# Patient Record
Sex: Female | Born: 1980 | Race: Black or African American | Hispanic: No | Smoking: Never smoker
Health system: Southern US, Community
[De-identification: ages and names within clinical notes are randomized; demographics above are authoritative.]

## PROBLEM LIST (undated history)

## (undated) DIAGNOSIS — E162 Hypoglycemia, unspecified: Secondary | ICD-10-CM

## (undated) DIAGNOSIS — Z789 Other specified health status: Secondary | ICD-10-CM

## (undated) HISTORY — DX: Hypoglycemia, unspecified: E16.2

---

## 2001-04-15 ENCOUNTER — Other Ambulatory Visit: Admission: RE | Admit: 2001-04-15 | Discharge: 2001-04-15 | Payer: Self-pay | Admitting: Family Medicine

## 2002-03-24 ENCOUNTER — Ambulatory Visit (HOSPITAL_COMMUNITY): Admission: RE | Admit: 2002-03-24 | Discharge: 2002-03-24 | Payer: Self-pay | Admitting: Internal Medicine

## 2002-03-24 ENCOUNTER — Encounter: Payer: Self-pay | Admitting: Internal Medicine

## 2002-05-13 ENCOUNTER — Ambulatory Visit (HOSPITAL_COMMUNITY): Admission: RE | Admit: 2002-05-13 | Discharge: 2002-05-13 | Payer: Self-pay | Admitting: Internal Medicine

## 2002-06-03 ENCOUNTER — Ambulatory Visit (HOSPITAL_COMMUNITY): Admission: RE | Admit: 2002-06-03 | Discharge: 2002-06-03 | Payer: Self-pay | Admitting: Internal Medicine

## 2003-03-05 ENCOUNTER — Emergency Department (HOSPITAL_COMMUNITY): Admission: EM | Admit: 2003-03-05 | Discharge: 2003-03-05 | Payer: Self-pay | Admitting: *Deleted

## 2003-04-03 ENCOUNTER — Encounter: Payer: Self-pay | Admitting: Urology

## 2003-04-03 ENCOUNTER — Ambulatory Visit (HOSPITAL_COMMUNITY): Admission: RE | Admit: 2003-04-03 | Discharge: 2003-04-03 | Payer: Self-pay | Admitting: Urology

## 2004-08-24 ENCOUNTER — Emergency Department (HOSPITAL_COMMUNITY): Admission: EM | Admit: 2004-08-24 | Discharge: 2004-08-24 | Payer: Self-pay | Admitting: Emergency Medicine

## 2005-01-23 ENCOUNTER — Ambulatory Visit (HOSPITAL_COMMUNITY): Admission: RE | Admit: 2005-01-23 | Discharge: 2005-01-23 | Payer: Self-pay | Admitting: Obstetrics and Gynecology

## 2005-08-26 ENCOUNTER — Emergency Department (HOSPITAL_COMMUNITY): Admission: EM | Admit: 2005-08-26 | Discharge: 2005-08-26 | Payer: Self-pay | Admitting: Emergency Medicine

## 2007-11-18 ENCOUNTER — Ambulatory Visit (HOSPITAL_COMMUNITY): Admission: RE | Admit: 2007-11-18 | Discharge: 2007-11-18 | Payer: Self-pay | Admitting: Obstetrics

## 2007-12-06 ENCOUNTER — Ambulatory Visit (HOSPITAL_COMMUNITY): Admission: RE | Admit: 2007-12-06 | Discharge: 2007-12-06 | Payer: Self-pay | Admitting: Obstetrics

## 2008-02-28 ENCOUNTER — Inpatient Hospital Stay (HOSPITAL_COMMUNITY): Admission: AD | Admit: 2008-02-28 | Discharge: 2008-02-28 | Payer: Self-pay | Admitting: Obstetrics

## 2008-03-03 ENCOUNTER — Inpatient Hospital Stay (HOSPITAL_COMMUNITY): Admission: AD | Admit: 2008-03-03 | Discharge: 2008-03-03 | Payer: Self-pay | Admitting: Obstetrics

## 2008-03-08 ENCOUNTER — Ambulatory Visit (HOSPITAL_COMMUNITY): Admission: RE | Admit: 2008-03-08 | Discharge: 2008-03-08 | Payer: Self-pay | Admitting: Obstetrics & Gynecology

## 2008-03-23 ENCOUNTER — Inpatient Hospital Stay (HOSPITAL_COMMUNITY): Admission: AD | Admit: 2008-03-23 | Discharge: 2008-03-23 | Payer: Self-pay | Admitting: Obstetrics

## 2008-04-16 ENCOUNTER — Inpatient Hospital Stay (HOSPITAL_COMMUNITY): Admission: AD | Admit: 2008-04-16 | Discharge: 2008-04-20 | Payer: Self-pay | Admitting: Obstetrics

## 2008-04-16 ENCOUNTER — Encounter: Payer: Self-pay | Admitting: Obstetrics

## 2009-10-18 IMAGING — US US FETAL BPP W/O NONSTRESS
1 series · 14 of 28 positions shown · non-contrast
Comparison: none

OBSTETRICAL ULTRASOUND:
 This ultrasound exam was performed in the [HOSPITAL] Ultrasound Department.  The OB US report was generated in the AS system, and faxed to the ordering physician.  This report is also available in [REDACTED] PACS.

[Series 1: us fetal bpp w/o nonstress · non-contrast · 30 acquisitions, 14 frames shown]
[im 2/30]
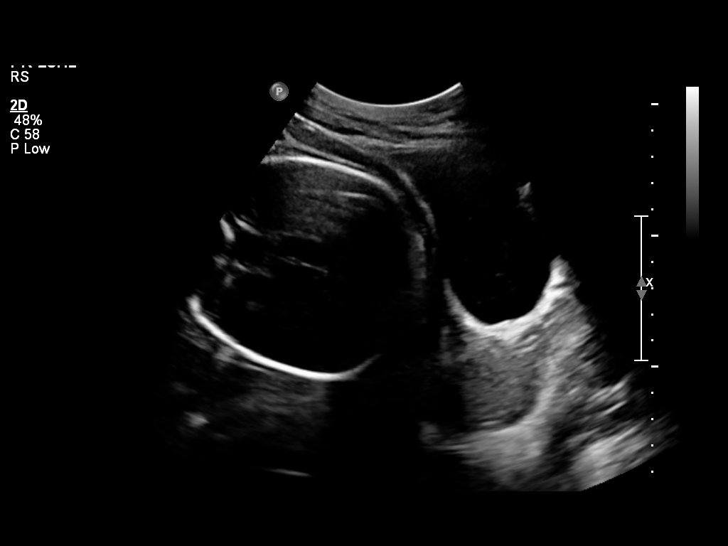
[im 4/30]
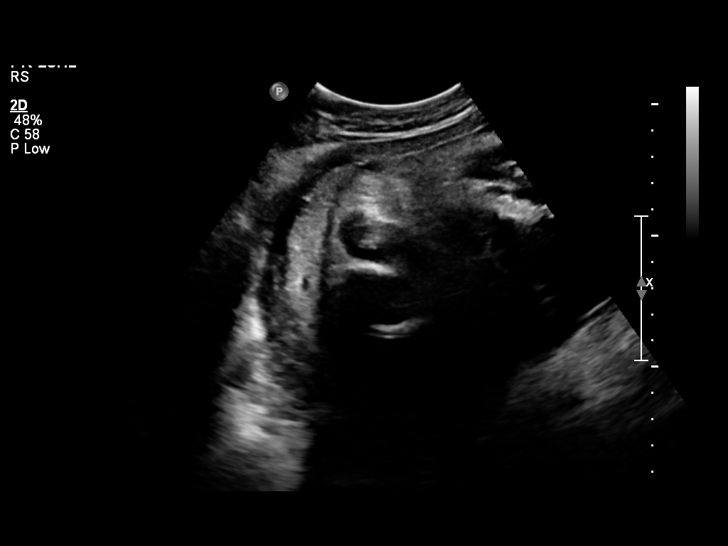
[im 6/30]
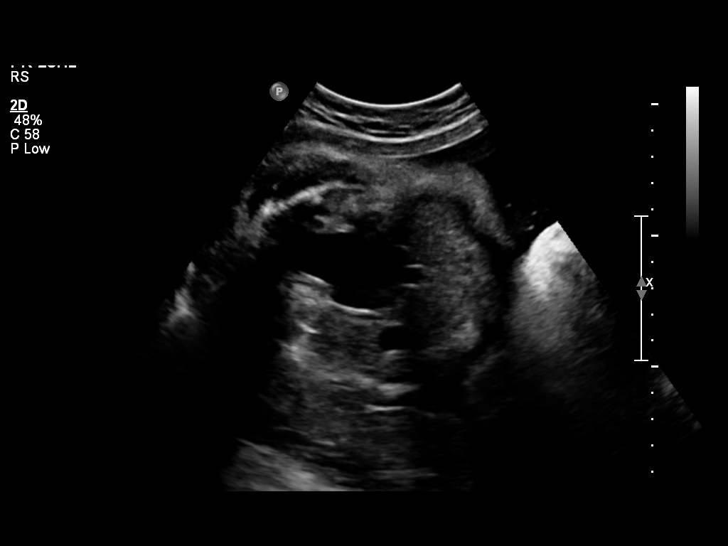
[im 8/30]
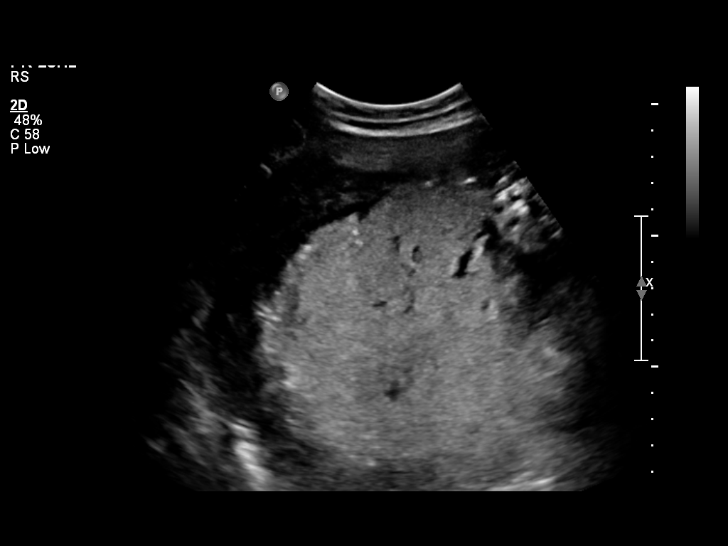
[im 10/30]
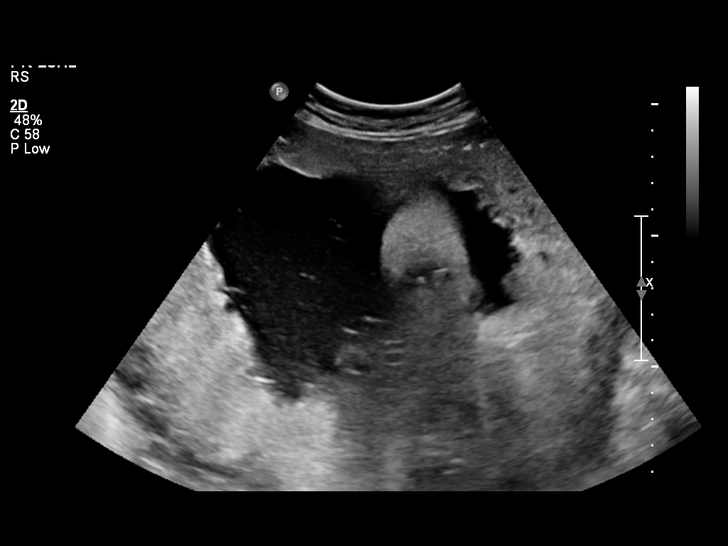
[im 12/30]
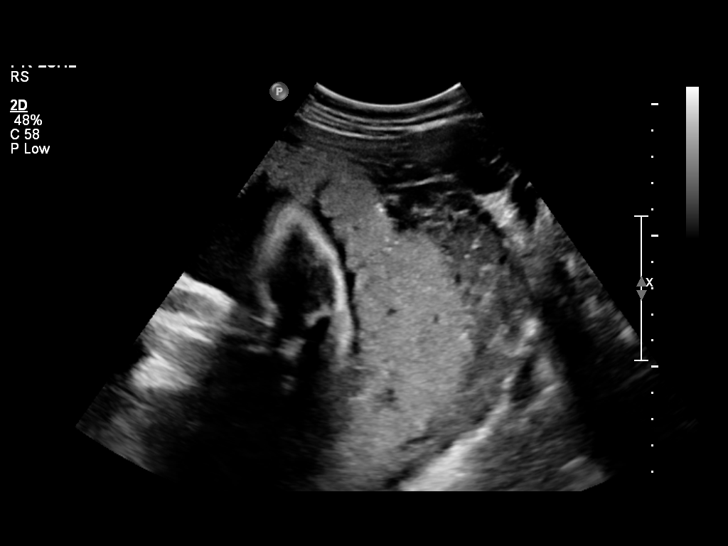
[im 14/30]
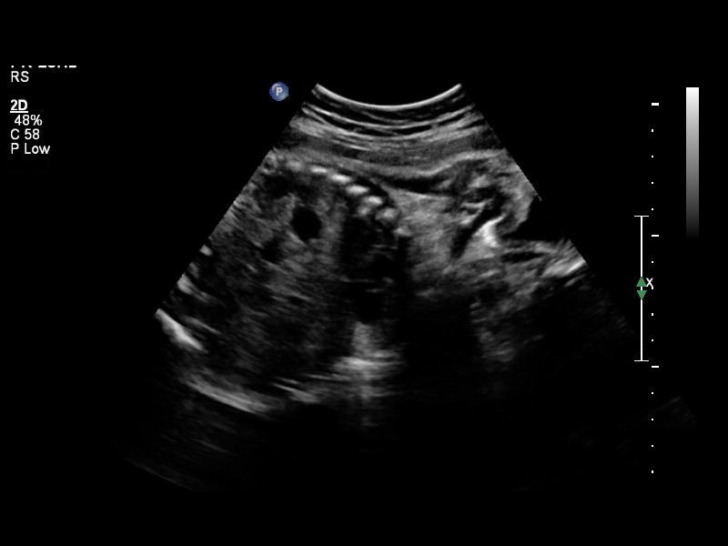
[im 17/30]
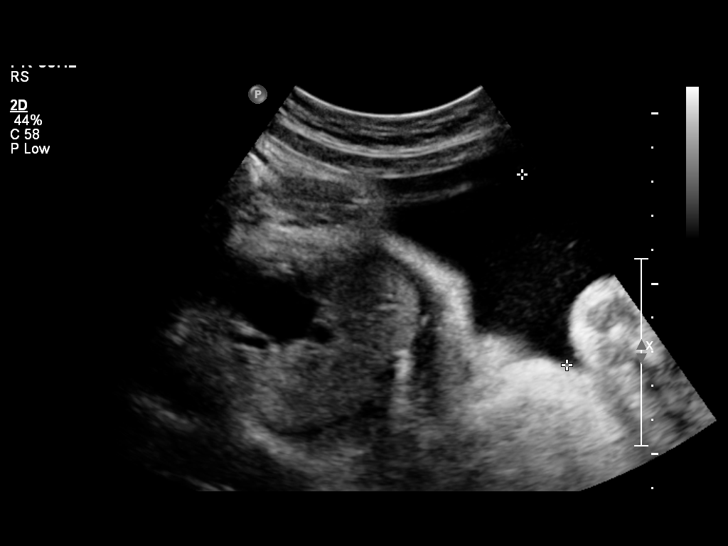
[im 19/30]
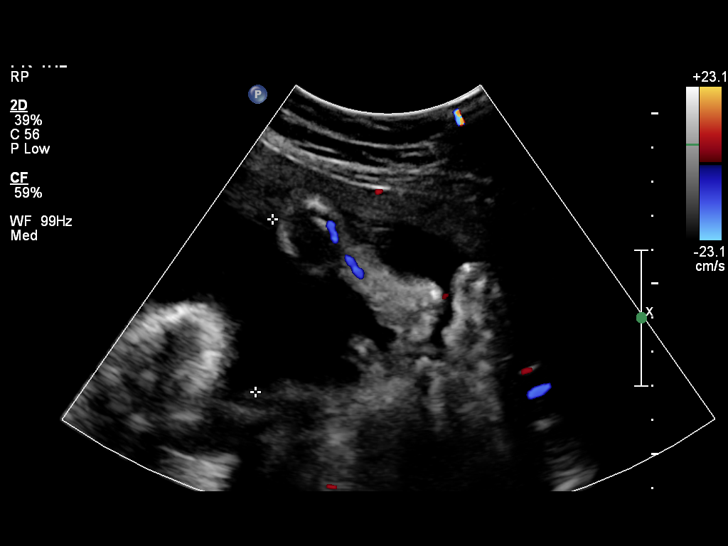
[im 21/30]
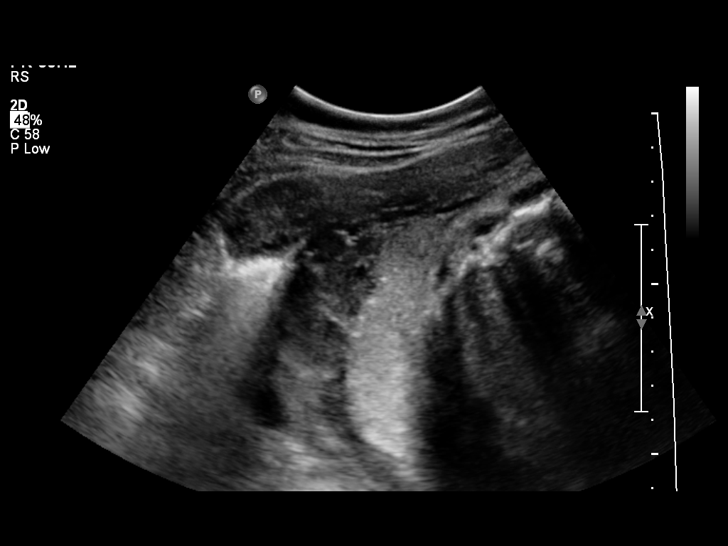
[im 23/30]
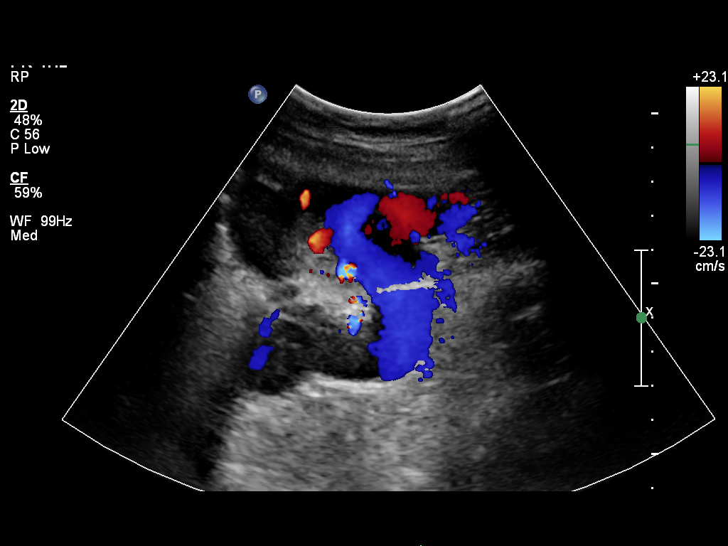
[im 25/30]
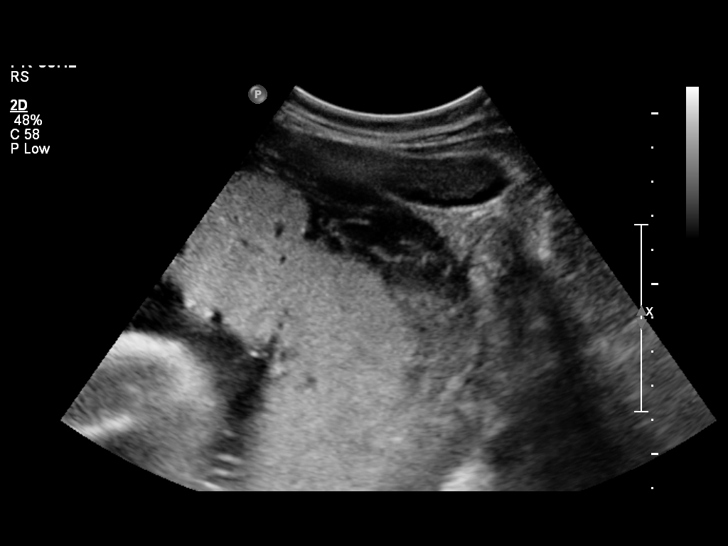
[im 27/30]
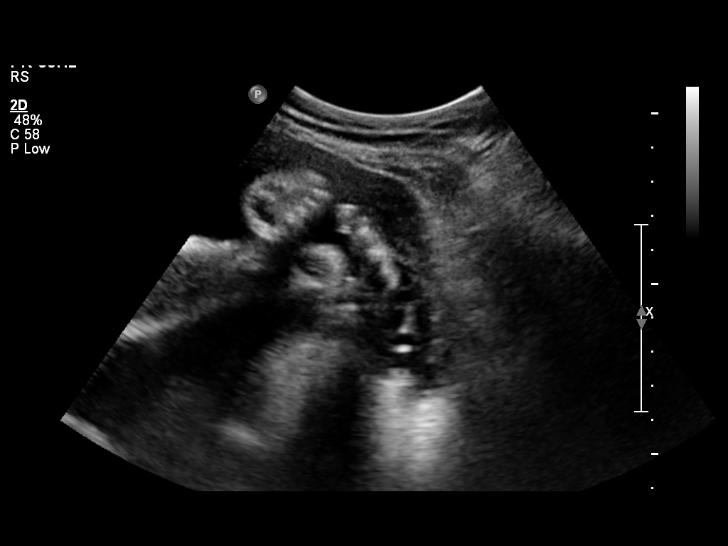
[im 30/30]
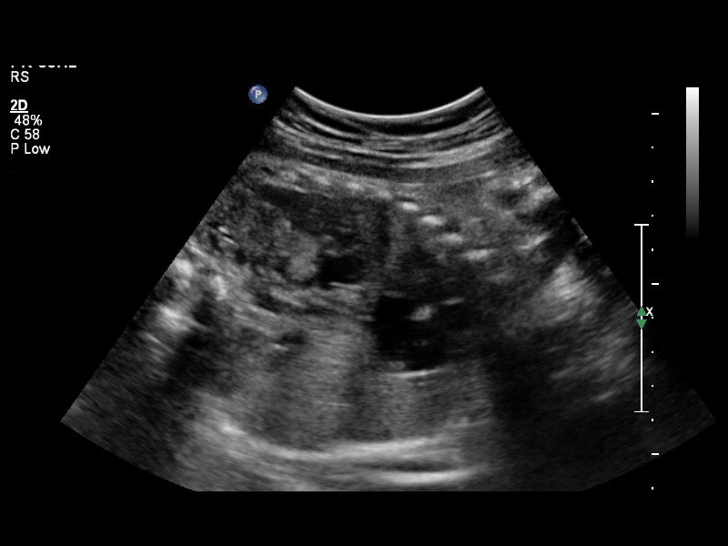

[14 of 28 positions shown; findings below may reference images not displayed]

IMPRESSION: See AS Obstetric US report.

## 2009-12-19 ENCOUNTER — Emergency Department (HOSPITAL_COMMUNITY): Admission: EM | Admit: 2009-12-19 | Discharge: 2009-12-19 | Payer: Self-pay | Admitting: Emergency Medicine

## 2010-01-14 ENCOUNTER — Emergency Department (HOSPITAL_COMMUNITY)
Admission: EM | Admit: 2010-01-14 | Discharge: 2010-01-14 | Payer: Self-pay | Source: Home / Self Care | Admitting: Family Medicine

## 2010-09-04 ENCOUNTER — Encounter
Admission: RE | Admit: 2010-09-04 | Discharge: 2010-10-02 | Payer: Self-pay | Source: Home / Self Care | Attending: Sports Medicine | Admitting: Sports Medicine

## 2010-10-06 ENCOUNTER — Encounter: Payer: Self-pay | Admitting: Obstetrics

## 2010-10-11 ENCOUNTER — Emergency Department (HOSPITAL_COMMUNITY)
Admission: EM | Admit: 2010-10-11 | Discharge: 2010-10-11 | Payer: Self-pay | Source: Home / Self Care | Admitting: Emergency Medicine

## 2011-01-28 NOTE — Op Note (Signed)
NAME:  Mackenzie Pierce, Mackenzie Pierce             ACCOUNT NO.:  [id number]   MEDICAL RECORD NO.:  [id number]          PATIENT TYPE:  INP   LOCATION:  9169                          FACILITY:  [facility]   PHYSICIAN:  Charles A. Clearance Coots, M.D.DATE OF BIRTH:  1981/03/13   DATE OF PROCEDURE:  04/17/2008  DATE OF DISCHARGE:                               OPERATIVE REPORT   PREOPERATIVE DIAGNOSES:  1. Late fetal heart rate decelerations.  2. Early first stage of labor.  3. Previous cesarean section.   POSTOPERATIVE DIAGNOSES:  1. Late fetal heart rate decelerations.  2. Early first stage of labor.  3. Previous cesarean section.   PROCEDURE:  Repeat low-transverse cesarean section.   SURGEON:  Charles A. Clearance Coots, MD   ASSISTANT:  Orlie Dakin, surgical technician.   ANESTHESIA:  Epidural.   ESTIMATED BLOOD LOSS:  700 mL.   IV FLUIDS:  2000 mL.   URINE OUTPUT:  300 mL clear.   COMPLICATIONS:  None.   DRAINS:  Foley to gravity.   FINDINGS:  Viable female at 23:45, Apgars of 8 at 1 and 9 at 5 minutes,  weight of 8 pounds 15 ounces.  Normal uterus, ovaries, and fallopian  tubes.   OPERATION:  The patient was brought to the operating room and after  satisfactory redosing of the epidural, the abdomen was prepped and  draped in the usual sterile fashion.  A Pfannenstiel skin incision was  made through the previous scar with a scalpel down to the fascia.  Fascia was nicked in the midline and the fascial incision was extended  to the left and to the right with curved Mayo scissors.  The superior  and inferior fascial edges were taken off the rectus muscle with blunt  and sharp dissection.  Rectus muscle was bluntly and sharply divided in  the midline.  Peritoneum was entered digitally and was digitally  extended to the left and to the right.  The bladder blade was  positioned.  The vesicouterine fold of peritoneum above the reflection  of the urinary bladder was grasped with forceps and was incised and  undermined with Metzenbaum scissors.  The incision was extended to the  left and to the right with the Metzenbaum scissors.  The bladder flap  was bluntly developed and the bladder blade was repositioned in front of  the urinary bladder placing it well out of the operative field.  The  uterus was then entered transversely in lower uterine segment with the  scalpel.  Clear amniotic fluid was expelled.  There were some flecks of  meconium in the amniotic fluid.  The vertex was noted to be left occiput  transverse and hyperextended and not well engaged.  The occiput was  brought up into the incision and could not be delivered with aid of  fundal pressure along the Mini VAC mushroom vacuum mixed extractor was  then applied to the occiput and was further flexed into the incision and  delivered with the aid of fundal pressure from the assistant.  The  infant's mouth and nose were suctioned with a suction bulb and the  delivery was  completed with the aid of fundal pressure from the  assistant.  The umbilical cord was doubly clamped and cut, and the  infant was handed off to the nursery staff.  The placenta was then  manually removed from the uterus, intact.  Cord blood was obtained.  Cord pH was 7.19.  The endometrial surface of the uterus was thoroughly  debrided with a dry lap sponge.  The edges of the uterine incision were  grasped with a ring forceps.  The uterus was closed with a continuous  interlocking suture of 0 Monocryl from each corner to the center.  Hemostasis was excellent.  Pelvic cavity was thoroughly irrigated with  warm saline solution.  All clots were removed.  The abdomen was then  closed as follows.  The parietal peritoneum was closed with a continuous  suture of 2-0 Monocryl.  The fascia was closed with continuous suture of  0 Vicryl.  The subcutaneous tissue was thoroughly irrigated with warm  saline solution.  All areas of subcutaneous bleeding was coagulated with  the  Bovie.  The skin was then approximated with surgical stainless steel  staples.  Sterile bandage was applied to the incision closure.  Surgical  technician indicated that all needle, sponge, and instrument counts were  correct x2.  The patient tolerated the procedure well and transported to  the recovery room in satisfactory condition.      Charles A. Clearance Coots, M.D.  Electronically Signed     CAH/MEDQ  D:  04/17/2008  T:  04/17/2008  Job:  [id number]

## 2011-01-31 NOTE — Op Note (Signed)
NAME:  Mackenzie Pierce                    ACCOUNT NO.:  [id number]   MEDICAL RECORD NO.:  [id number]                   PATIENT TYPE:  AMB   LOCATION:  DAY                                  FACILITY:  [facility]   PHYSICIAN:  Gerrit Friends. Rourk, M.D.               DATE OF BIRTH:  1981-07-26   DATE OF PROCEDURE:  05/13/2002  DATE OF DISCHARGE:                                 OPERATIVE REPORT   PROCEDURE:  Incomplete colonoscopy.   ENDOSCOPIST:  Gerrit Friends. Rourk, M.D.   INDICATIONS FOR PROCEDURE:  The patient is a 30 year old lady with a weight  loss and chronic diarrhea.  Small bowel not adequately imaged in the terms  of the terminal ileum on recent small-bowel follow through.  There is  concern about Crohn's disease.  Consequently colonoscopy with ileostomy is  now being attempted to further evaluate the cause of her diarrhea.  This  approach has been discussed with the patient, previously, at length in the  office and again today at the bedside.  The potential risks, benefits, and  alternatives have been reviewed; questions answered. She is agreeable.  Please see the dictated office note of 04/25/02 for more information.   DESCRIPTION OF PROCEDURE:  O2 saturation, blood pressure, pulse and  respirations were monitored throughout the entire procedure.   CONSCIOUS SEDATION:  Versed 4 mg IV, Demerol 50 mg IV in divided doses.   INSTRUMENT:  Olympus video chip pediatric colonoscope.   FINDINGS:  Digital rectal exam revealed no abnormalities.   ENDOSCOPIC FINDINGS:  Unfortunately the prep was poor.   RECTUM:  Examination of the rectum revealed no abnormalities.  The colon  appeared to be quite elongated and redundant for this small-framed  individual.  There was quite a bit of liquid, formed, and semi-formed stool  upstream to the point where there was an air fluid level.  I attempted to  suction this material out, as best I could, but the scope kept getting  clogged up.  It was  decided that it would not be fruitful to attempt further  advancement of the scope in this setting.   I feel that I probably saw a good part of at least the left colon.  The  mucosa appeared normal otherwise.  However, poor prep impeded the process.  The exam was terminated.   The patient tolerated the attempt very well.   IMPRESSION:  1. Essentially normal rectum.  2. Somewhat elongated, redundant colon with poor prep precluding     examination.   RECOMMENDATIONS:  The patient will need to be re-prepped, hopefully, to an  adequate degree so that the exam can be completed in the very near future.  Gerrit Friends. Rourk, M.D.    RMR/MEDQ  D:  05/13/2002  T:  05/14/2002  Job:  [id number]

## 2011-01-31 NOTE — Discharge Summary (Signed)
NAME:  Mackenzie Pierce, Mackenzie Pierce             ACCOUNT NO.:  [id number]   MEDICAL RECORD NO.:  [id number]          PATIENT TYPE:  INP   LOCATION:  9135                          FACILITY:  [facility]   PHYSICIAN:  Charles A. Clearance Coots, M.D.DATE OF BIRTH:  1981/03/09   DATE OF ADMISSION:  04/16/2008  DATE OF DISCHARGE:  04/20/2008                               DISCHARGE SUMMARY   ADMITTING DIAGNOSES:  1. A 37 weeks' gestation.  2. Early labor.  3. Vaginal birth after cesarean section.   DISCHARGE DIAGNOSES:  1. A 37 weeks' gestation.  2. Early labor.  3. Vaginal birth after cesarean section.  4. Status post repeat low-transverse cesarean section on April 16, 2008 for late fetal heart rate decelerations.   Viable female was delivered at 2345.  Apgars of 8 at one minute and 9 at  five minutes, weight of 4070 g, length of 54.61 cm.  Mother and infant  discharged home in a good condition.   REASON FOR ADMISSION:  A 30 year old G2, P83, estimated date of  confinement of May 04, 2008, presents with uterine contractions.  Nonstress test was nonreactive.  The patient was admitted for trial of  labor after previous cesarean section.   PAST MEDICAL HISTORY/SURGERY:  Cesarean section for arrest of  dilatation.   ILLNESSES:  Anemia.   MEDICATIONS:  Prenatal vitamins.   ALLERGIES:  TYLENOL causes angioedema.   SOCIAL HISTORY:  Separated.  Negative tobacco, alcohol, or recreational  drug use.   FAMILY HISTORY:  Positive for breast cancer, diabetes, and  cardiovascular disease.   PHYSICAL EXAMINATION:  GENERAL:  Well-nourished, well-developed female  in no acute distress, afebrile.  VITAL SIGNS:  Stable.  LUNGS:  Clear to auscultation bilaterally.  HEART:  Regular rate and rhythm.  ABDOMEN:  Gravid, nontender.  Cervix 2-cm dilated, 90% effaced, and  vertex at -2 station.   ADMITTING LABS:  Hemoglobin 11.1, hematocrit 34.3, white blood cell  count 6800, and platelets 276,000.  Comprehensive  metabolic panel was  within normal limits.  Urinalysis was within normal limits.  RPR was  nonreactive.   HOSPITAL COURSE:  The patient was admitted and progressed to 4-cm  dilatation and then had the onset of late fetal heart rate  decelerations, which did not correct with oxygen administration and  position changes.  A decision was made to proceed with repeat cesarean  section delivery for late fetal heart rate decelerations.  A repeat low-  transverse cesarean section was performed on April 17, 2008.  There were  no intraoperative complications.  Postoperative course was  uncomplicated.  The patient was discharged home on postop day #3 in good  condition.   DISCHARGE LABS:  Hemoglobin 9.4, hematocrit 29.0, white blood cell count  6800, and platelets 208,000.   DISCHARGE DISPOSITION:  Medications, ibuprofen and Percocet was  prescribed for pain.  Continue prenatal vitamins.  Routine written  instructions were given for discharge after cesarean section.  The  patient is to call our office for a followup appointment in 2 weeks.      Charles A. Clearance Coots, M.D.  Electronically Signed     CAH/MEDQ  D:  05/12/2008  T:  05/13/2008  Job:  [id number]

## 2011-01-31 NOTE — Procedures (Signed)
NAME:  Mackenzie Pierce, Mackenzie Pierce             ACCOUNT NO.:  [id number]   MEDICAL RECORD NO.:  [id number]          PATIENT TYPE:  OUT   LOCATION:  RAD                           FACILITY:  [facility]   PHYSICIAN:  [place] Bing, M.D.  DATE OF BIRTH:  12/08/1980   DATE OF PROCEDURE:  01/23/2005  DATE OF DISCHARGE:                                  ECHOCARDIOGRAM   REFERRING PHYSICIAN:  Dr. Emelda Fear.   CLINICAL DATA:  A 30 year old woman with chest pain.   M-MODE:  Aorta 2.7, left atrium 3.1, septum 1.0, posterior wall 0.9, LV  diastole 3.6, LV systole 2.7.   1.  Technically adequate echocardiographic study.  2.  Normal left atrium, right atrium and right ventricle.  3.  Normal aortic, mitral, tricuspid and pulmonic valves.  4.  Normal proximal pulmonary artery.  5.  Normal IVC.  6.  Normal internal dimension, wall thickness, regional and global function      of the left ventricle.  7.  No pericardial effusion.      RR/MEDQ  D:  01/23/2005  T:  01/24/2005  Job:  [id number]

## 2011-01-31 NOTE — Op Note (Signed)
NAME:  Mackenzie Pierce                    ACCOUNT NO.:  [id number]   MEDICAL RECORD NO.:  [id number]                   PATIENT TYPE:  AMB   LOCATION:  DAY                                  FACILITY:  [facility]   PHYSICIAN:  Gerrit Friends. Rourk, M.D.               DATE OF BIRTH:  [birth year]   DATE OF PROCEDURE:  06/03/2002  DATE OF DISCHARGE:                                 OPERATIVE REPORT   PROCEDURE:  Colonoscopy with ileoscopy.   INDICATIONS FOR PROCEDURE:  The patient is a 30 year old lady with weight  loss diarrhea. Small bowel follow-through inconclusive as the terminal ileum  could not be seen very well. Stool studies have been negative. Attempted  colonoscopy a few weeks ago but a poor prep precluded the examination and  she now returns hopefully in the presence of a good prep. This approach has  been discussed with [name]. Our plan is to directly visualize the  terminal ileum and rule out Crohn's disease. This approach has been  discussed with [name] at length. The potential risks, benefits, and  alternatives have been reviewed questions answered and she is agreeable.  Please see my handwritten H&P for more information. She is low risk for  conscious sedation and we have Versed and Demerol.   MONITORING:  O2 saturation, blood pressure, pulse and respirations were  monitored throughout the entire procedure.   CONSCIOUS SEDATION:  Versed 3 mg IV, Demerol 75 mg IV in divided doses.   INSTRUMENT:  Olympus video chip colonoscope.   FINDINGS:  Digital rectal exam revealed no abnormalities.   ENDOSCOPIC FINDINGS:  The prep was good today.   RECTUM:  Examination of the rectal mucosa including retroflexed view of the  anal verge revealed no abnormalities.   COLON:  The colonic mucosa was surveyed from the rectosigmoid junction  through the left transverse right colon to the area of the appendiceal  orifice, ileocecal valve and cecum. These structures were seen and  photographed for the record. The colonic mucosa all the way to the cecum  appeared normal. The terminal ileum was intubated a good 20 cm. This segment  of GI tract also appeared normal.   From this level, the scope was slowly withdrawn. All previously mentioned  mucosal surfaces were again seen. No other abnormalities were observed. The  patient tolerated the procedure well and was reacted in endoscopy.   IMPRESSION:  Normal rectum, terminal ileum. Normal colon.   Today's findings makes Crohns disease highly unlikely.   [name] tells me her diarrhea has gotten better. She used to have 10  plus loose stools daily and now she is down to four. She is not having any  nausea or vomiting.   RECOMMENDATIONS:  1. Librax one tablet orally b.i.d. p.r.n. diarrhea.  2. Office visit with me in four weeks.  Gerrit Friends. Rourk, M.D.    RMR/MEDQ  D:  06/03/2002  T:  06/03/2002  Job:  [id number]

## 2011-06-12 LAB — URINALYSIS, ROUTINE W REFLEX MICROSCOPIC
Glucose, UA: >1000 — AB
Hgb urine dipstick: NEGATIVE
Ketones, ur: 15 — AB
Protein, ur: NEGATIVE

## 2011-06-12 LAB — COMPREHENSIVE METABOLIC PANEL
ALT: 16
Albumin: 2.4 — ABNORMAL LOW
Alkaline Phosphatase: 226 — ABNORMAL HIGH
BUN: 4 — ABNORMAL LOW
GFR calc Af Amer: >60
Potassium: 3.7
Sodium: 136
Total Protein: 5.4 — ABNORMAL LOW

## 2011-06-12 LAB — WET PREP, GENITAL
Clue Cells Wet Prep HPF POC: NONE SEEN
Clue Cells Wet Prep HPF POC: NONE SEEN
Trich, Wet Prep: NONE SEEN
Trich, Wet Prep: NONE SEEN
Yeast Wet Prep HPF POC: NONE SEEN

## 2011-06-12 LAB — STREP B DNA PROBE: Strep Group B Ag: NEGATIVE

## 2011-06-12 LAB — CBC
Platelets: 220
RDW: 15.7 — ABNORMAL HIGH

## 2011-06-12 LAB — URINE MICROSCOPIC-ADD ON

## 2011-06-13 LAB — COMPREHENSIVE METABOLIC PANEL
AST: 22
CO2: 21
Calcium: 9.3
Creatinine, Ser: 0.59
GFR calc Af Amer: >60
GFR calc non Af Amer: >60

## 2011-06-13 LAB — URINALYSIS, ROUTINE W REFLEX MICROSCOPIC
Leukocytes, UA: NEGATIVE
Nitrite: NEGATIVE
Specific Gravity, Urine: 1.015
Urobilinogen, UA: 0.2

## 2011-06-13 LAB — CBC
MCHC: 32.5
MCHC: 32.5
MCV: 75.7 — ABNORMAL LOW
Platelets: 208
RBC: 4.53
RDW: 17.1 — ABNORMAL HIGH

## 2011-06-13 LAB — URINE MICROSCOPIC-ADD ON

## 2011-06-13 LAB — RPR: RPR Ser Ql: NONREACTIVE

## 2011-09-24 ENCOUNTER — Inpatient Hospital Stay (HOSPITAL_COMMUNITY)
Admission: AD | Admit: 2011-09-24 | Discharge: 2011-09-24 | Disposition: A | Discharge status: Home / Self Care | Payer: Medicaid Other | Source: Ambulatory Visit | Attending: Obstetrics | Admitting: Obstetrics

## 2011-09-24 ENCOUNTER — Encounter (HOSPITAL_COMMUNITY): Payer: Self-pay

## 2011-09-24 DIAGNOSIS — N938 Other specified abnormal uterine and vaginal bleeding: Secondary | ICD-10-CM | POA: Insufficient documentation

## 2011-09-24 DIAGNOSIS — N760 Acute vaginitis: Secondary | ICD-10-CM | POA: Insufficient documentation

## 2011-09-24 DIAGNOSIS — N898 Other specified noninflammatory disorders of vagina: Secondary | ICD-10-CM

## 2011-09-24 DIAGNOSIS — N949 Unspecified condition associated with female genital organs and menstrual cycle: Secondary | ICD-10-CM | POA: Insufficient documentation

## 2011-09-24 DIAGNOSIS — A499 Bacterial infection, unspecified: Secondary | ICD-10-CM | POA: Insufficient documentation

## 2011-09-24 DIAGNOSIS — N939 Abnormal uterine and vaginal bleeding, unspecified: Secondary | ICD-10-CM

## 2011-09-24 DIAGNOSIS — D649 Anemia, unspecified: Secondary | ICD-10-CM | POA: Insufficient documentation

## 2011-09-24 DIAGNOSIS — B9689 Other specified bacterial agents as the cause of diseases classified elsewhere: Secondary | ICD-10-CM | POA: Insufficient documentation

## 2011-09-24 HISTORY — DX: Other specified health status: Z78.9

## 2011-09-24 LAB — CBC
Platelets: 340 10*3/uL (ref 150–400)
RBC: 3.68 MIL/uL — ABNORMAL LOW (ref 3.87–5.11)
RDW: 19 % — ABNORMAL HIGH (ref 11.5–15.5)
WBC: 6.7 10*3/uL (ref 4.0–10.5)

## 2011-09-24 LAB — WET PREP, GENITAL: Trich, Wet Prep: NONE SEEN

## 2011-09-24 LAB — POCT PREGNANCY, URINE: Preg Test, Ur: NEGATIVE

## 2011-09-24 MED ORDER — METRONIDAZOLE 500 MG PO TABS: 500.0000 mg | ORAL_TABLET | Freq: Two times a day (BID) | ORAL | Status: AC

## 2011-09-24 MED ORDER — FERROUS SULFATE 325 (65 FE) MG PO TABS: 325.0000 mg | ORAL_TABLET | Freq: Every day | ORAL | Status: DC

## 2011-09-24 NOTE — Progress Notes (Addendum)
Heavy bleeding/daily since 12/1, worse past 2 days- heavier, with clots.. implanon removed/replaced in Oct. 2012.  Feel moody, somewhat lightheaded. No  Appetite, just want ice.

## 2011-09-24 NOTE — ED Provider Notes (Signed)
History     Chief Complaint  Patient presents with  . Vaginal Bleeding   HPILesharner Mackenzie Pierce 31 y.o. presents with vaginal bleeding for 1 month.  Patient of Dr. Verdell Carmine.  Reports having implanon that was removed in Oct and a Nexaplon inserted same day.  Took OCAs with Implanon to control cycles.  Since Nexaplon, she took OCAs for 1 month after insertion.  Had vaginal bleeding at Thanksgiving for 2 weeks, stopped for 2 weeks and restarted 12/10.  Since then daily bleeding with clots, no pain.  1 sexual partners. Had STD testing negative last spring.   Negative pregnancy today.     Past Medical History  Diagnosis Date  . No pertinent past medical history     Past Surgical History  Procedure Date  . Cesarean section     Family History  Problem Relation Age of Onset  . Anesthesia problems Neg Hx   . Hypotension Neg Hx   . Malignant hyperthermia Neg Hx   . Pseudochol deficiency Neg Hx     History  Substance Use Topics  . Smoking status: Never Smoker   . Smokeless tobacco: Not on file  . Alcohol Use: No    Allergies:  Allergies  Allergen Reactions  . Tylenol (Acetaminophen) Swelling    Any tylenol based medication makes her swell    No prescriptions prior to admission    Review of Systems  Constitutional: Negative.   HENT: Negative.   Gastrointestinal: Negative for abdominal pain.  Genitourinary:       + vaginal bleeding.   Physical Exam   Blood pressure 126/69, pulse 89, temperature 99.3 F (37.4 Mackenzie), temperature source Oral, resp. rate 18, height 4\' 11"  (1.499 m), weight 137 lb 12.8 oz (62.506 kg), last menstrual period 08/25/2011, SpO2 98.00%.  Physical Exam  Constitutional: She is oriented to person, place, and time. She appears well-developed and well-nourished. No distress.  HENT:  Head: Normocephalic.  Neck: Normal range of motion.  Cardiovascular: Normal rate.   Respiratory: Effort normal.  GI: Soft. She exhibits no distension and no mass. There is  no tenderness. There is no rebound and no guarding.  Genitourinary: Uterus is tender. Uterus is not enlarged. Right adnexum displays no mass, no tenderness and no fullness. Left adnexum displays no mass, no tenderness and no fullness. There is bleeding (light bleeding without clot.  ) around the vagina. No tenderness around the vagina. No vaginal discharge found.  Neurological: She is alert and oriented to person, place, and time.  Skin: Skin is warm and dry.   Results for orders placed during the hospital encounter of 09/24/11 (from the past 24 hour(s))  POCT PREGNANCY, URINE     Status: Normal   Collection Time   09/24/11  3:57 PM      Component Value Range   Preg Test, Ur NEGATIVE    WET PREP, GENITAL     Status: Abnormal   Collection Time   09/24/11  4:23 PM      Component Value Range   Yeast, Wet Prep NONE SEEN  NONE SEEN    Trich, Wet Prep NONE SEEN  NONE SEEN    Clue Cells, Wet Prep FEW (*) NONE SEEN    WBC, Wet Prep HPF POC NONE SEEN  NONE SEEN   CBC     Status: Abnormal   Collection Time   09/24/11  4:35 PM      Component Value Range   WBC 6.7  4.0 - 10.5 (  K/uL)   RBC 3.68 (*) 3.87 - 5.11 (MIL/uL)   Hemoglobin 7.2 (*) 12.0 - 15.0 (g/dL)   HCT 21.3 (*) 08.6 - 46.0 (%)   MCV 68.2 (*) 78.0 - 100.0 (fL)   MCH 19.6 (*) 26.0 - 34.0 (pg)   MCHC 28.7 (*) 30.0 - 36.0 (g/dL)   RDW 57.8 (*) 46.9 - 15.5 (%)   Platelets 340  150 - 400 (K/uL)    MAU Course  Procedures  GC/CHL culture to lab  MDM  Reported to Dr. Frances Maywood tx bacterial vaginosis and anemia.  Followup in the office.    Assessment and Plan  A: abnormal vaginal bleeding     Anemia     Bacterial vaginosis  P: Rx for Flagyl     Rx for ferrous sulfate 1 tab po bid     Call Dr. Verdell Carmine office for appointment for re-evaluation.    Mackenzie Pierce,EVE M 09/24/2011, 4:13 PM   Matt Holmes, NP 09/24/11 1728

## 2011-09-24 NOTE — Progress Notes (Signed)
Pt reports abdominal "heaviness" & vaginal bleeding with clots x 22month. Denies abdominal cramping.

## 2011-11-27 ENCOUNTER — Other Ambulatory Visit: Payer: Self-pay | Admitting: Obstetrics

## 2011-11-27 ENCOUNTER — Inpatient Hospital Stay (HOSPITAL_COMMUNITY)
Admission: AD | Admit: 2011-11-27 | Discharge: 2011-11-27 | Disposition: A | Discharge status: Home / Self Care | Payer: Medicaid Other | Source: Ambulatory Visit | Attending: Obstetrics | Admitting: Obstetrics

## 2011-11-27 DIAGNOSIS — N938 Other specified abnormal uterine and vaginal bleeding: Secondary | ICD-10-CM | POA: Insufficient documentation

## 2011-11-27 DIAGNOSIS — N949 Unspecified condition associated with female genital organs and menstrual cycle: Secondary | ICD-10-CM | POA: Insufficient documentation

## 2011-11-27 MED ORDER — MEDROXYPROGESTERONE ACETATE 400 MG/ML IM SUSP
400.0000 mg | Freq: Once | INTRAMUSCULAR | Status: AC
  Administered 2011-11-27: 400 mg via INTRAMUSCULAR
  Filled 2011-11-27: qty 1

## 2011-12-12 ENCOUNTER — Inpatient Hospital Stay (HOSPITAL_COMMUNITY)
Admission: AD | Admit: 2011-12-12 | Discharge: 2011-12-12 | Disposition: A | Discharge status: Home / Self Care | Payer: Medicaid Other | Source: Ambulatory Visit | Attending: Obstetrics | Admitting: Obstetrics

## 2011-12-12 DIAGNOSIS — N949 Unspecified condition associated with female genital organs and menstrual cycle: Secondary | ICD-10-CM | POA: Insufficient documentation

## 2011-12-12 DIAGNOSIS — N938 Other specified abnormal uterine and vaginal bleeding: Secondary | ICD-10-CM | POA: Insufficient documentation

## 2011-12-12 MED ORDER — MEDROXYPROGESTERONE ACETATE 400 MG/ML IM SUSP
400.0000 mg | Freq: Once | INTRAMUSCULAR | Status: AC
  Administered 2011-12-12: 400 mg via INTRAMUSCULAR
  Filled 2011-12-12: qty 1

## 2011-12-12 NOTE — MAU Note (Signed)
Patient here for Depoprovera injection for abnormal vaginal bleeding was supposed to come yesterday but did not get off work until 8:30 p.m.

## 2012-12-13 ENCOUNTER — Ambulatory Visit: Payer: Self-pay | Admitting: Obstetrics

## 2012-12-13 ENCOUNTER — Encounter: Payer: Self-pay | Admitting: Obstetrics

## 2012-12-13 ENCOUNTER — Ambulatory Visit (INDEPENDENT_AMBULATORY_CARE_PROVIDER_SITE_OTHER): Payer: Medicaid Other | Admitting: Obstetrics

## 2012-12-13 VITALS — BP 137/91 | HR 90 | Temp 97.0°F | Wt 149.0 lb

## 2012-12-13 DIAGNOSIS — R109 Unspecified abdominal pain: Secondary | ICD-10-CM

## 2012-12-13 DIAGNOSIS — N390 Urinary tract infection, site not specified: Secondary | ICD-10-CM

## 2012-12-13 DIAGNOSIS — Z30431 Encounter for routine checking of intrauterine contraceptive device: Secondary | ICD-10-CM

## 2012-12-13 DIAGNOSIS — R319 Hematuria, unspecified: Secondary | ICD-10-CM

## 2012-12-13 DIAGNOSIS — N760 Acute vaginitis: Secondary | ICD-10-CM

## 2012-12-13 LAB — POCT URINALYSIS DIPSTICK
Glucose, UA: NEGATIVE
Nitrite, UA: POSITIVE
Urobilinogen, UA: NEGATIVE

## 2012-12-13 MED ORDER — NITROFURANTOIN MONOHYD MACRO 100 MG PO CAPS: 100.0000 mg | ORAL_CAPSULE | Freq: Two times a day (BID) | ORAL | Status: DC

## 2012-12-13 NOTE — Addendum Note (Signed)
Addended by: Julaine Hua on: 12/13/2012 05:08 PM   Modules accepted: Orders

## 2012-12-13 NOTE — Progress Notes (Signed)
Subjective:     Mackenzie Pierce is a 32 y.o. female here for a routine exam.  Current complaints: Persistent HA's.  Personal health questionnaire reviewed: yes.   Gynecologic History No LMP recorded. Contraception: IUD Last Pap: 2013. Results were: normal Last mammogram: n/a. Results were: n/a  Obstetric History OB History   Grav Para Term Preterm Abortions TAB SAB Ect Mult Living   2 2 2  0 0 0 0 0 0 2     # Outc Date GA Lbr Len/2nd Wgt Sex Del Anes PTL Lv   1 TRM      LTCS   Yes   Comments: failure to progress   2 TRM      LTCS   Yes   Comments: repeat c/section       The following portions of the patient's history were reviewed and updated as appropriate: allergies, current medications, past family history, past medical history, past social history, past surgical history and problem list.  Review of Systems A comprehensive review of systems was negative except for: Neurological: positive for headaches    Objective:    Abdomen: normal findings: soft, non-tender Pelvic: cervix normal in appearance, external genitalia normal, no adnexal masses or tenderness, no cervical motion tenderness, uterus normal size, shape, and consistency and vagina normal without discharge    IUD string visible.  Assessment:    Healthy female exam.    Plan:    Education reviewed: safe sex/STD prevention. Contraception: IUD.

## 2012-12-14 LAB — WET PREP BY MOLECULAR PROBE: Gardnerella vaginalis: NEGATIVE

## 2012-12-15 LAB — URINE CULTURE: Colony Count: >=100000

## 2012-12-21 ENCOUNTER — Other Ambulatory Visit: Payer: Self-pay | Admitting: *Deleted

## 2012-12-21 DIAGNOSIS — N390 Urinary tract infection, site not specified: Secondary | ICD-10-CM

## 2012-12-21 MED ORDER — AMPICILLIN 500 MG PO CAPS: 500.0000 mg | ORAL_CAPSULE | Freq: Four times a day (QID) | ORAL | Status: DC

## 2013-05-27 ENCOUNTER — Emergency Department (HOSPITAL_COMMUNITY)
Admission: EM | Admit: 2013-05-27 | Discharge: 2013-05-28 | Disposition: A | Discharge status: Home / Self Care | Payer: Medicaid Other | Attending: Emergency Medicine | Admitting: Emergency Medicine

## 2013-05-27 ENCOUNTER — Encounter (HOSPITAL_COMMUNITY): Payer: Self-pay | Admitting: Emergency Medicine

## 2013-05-27 DIAGNOSIS — IMO0002 Reserved for concepts with insufficient information to code with codable children: Secondary | ICD-10-CM | POA: Insufficient documentation

## 2013-05-27 DIAGNOSIS — T148XXA Other injury of unspecified body region, initial encounter: Secondary | ICD-10-CM

## 2013-05-27 DIAGNOSIS — Y9389 Activity, other specified: Secondary | ICD-10-CM | POA: Insufficient documentation

## 2013-05-27 DIAGNOSIS — Y92410 Unspecified street and highway as the place of occurrence of the external cause: Secondary | ICD-10-CM | POA: Insufficient documentation

## 2013-05-27 MED ORDER — IBUPROFEN 800 MG PO TABS
800.0000 mg | ORAL_TABLET | Freq: Once | ORAL | Status: AC
  Administered 2013-05-28: 800 mg via ORAL
  Filled 2013-05-27: qty 1
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
## 2013-05-28 ENCOUNTER — Emergency Department (HOSPITAL_COMMUNITY): Payer: Medicaid Other

## 2013-05-28 MED ORDER — NAPROXEN 500 MG PO TABS: 500.0000 mg | ORAL_TABLET | Freq: Two times a day (BID) | ORAL | Status: DC

## 2013-05-28 MED ORDER — CYCLOBENZAPRINE HCL 10 MG PO TABS: 10.0000 mg | ORAL_TABLET | Freq: Three times a day (TID) | ORAL | Status: DC | PRN

## 2013-05-28 NOTE — ED Provider Notes (Signed)
Medical screening examination/treatment/procedure(s) were performed by non-physician practitioner and as supervising physician I was immediately available for consultation/collaboration.   Clare Fennimore, MD 05/28/13 0736 

## 2013-07-26 ENCOUNTER — Ambulatory Visit (INDEPENDENT_AMBULATORY_CARE_PROVIDER_SITE_OTHER): Payer: Medicaid Other | Admitting: Obstetrics

## 2013-07-26 VITALS — BP 136/87 | HR 68 | Temp 97.7°F | Wt 147.0 lb

## 2013-07-26 DIAGNOSIS — Z30431 Encounter for routine checking of intrauterine contraceptive device: Secondary | ICD-10-CM

## 2013-07-26 NOTE — Progress Notes (Signed)
Subjective:     Mackenzie Pierce is a 32 y.o. female here for a routine exam.  Current complaints: an IUD check.  Personal health questionnaire reviewed: yes.   Gynecologic History No LMP recorded. Patient is not currently having periods (Reason: IUD). Contraception: IUD Last Pap: 2011 . Results were: normal Last mammogram: n/a. Results were: n/a  Obstetric History OB History  Gravida Para Term Preterm AB SAB TAB Ectopic Multiple Living  2 2 2  0 0 0 0 0 0 2    # Outcome Date GA Lbr Len/2nd Weight Sex Delivery Anes PTL Lv  2 TRM      LTCS   Y     Comments: repeat c/section  1 TRM      LTCS   Y     Comments: failure to progress       The following portions of the patient's history were reviewed and updated as appropriate: allergies, current medications, past family history, past medical history, past social history, past surgical history and problem list.  Review of Systems Pertinent items are noted in HPI.    Objective:    General appearance: alert and no distress Abdomen: normal findings: soft, non-tender Pelvic: cervix normal in appearance, external genitalia normal, no adnexal masses or tenderness, no cervical motion tenderness, rectovaginal septum normal, uterus normal size, shape, and consistency and vagina normal without discharge  .  IUD string visible, normal length.  Assessment:    IUD Surveillance.  Doing well.   Plan:    Education reviewed: safe sex/STD prevention. Contraception: IUD. Follow up in: several months.

## 2013-07-27 ENCOUNTER — Encounter: Payer: Self-pay | Admitting: Obstetrics

## 2013-08-01 ENCOUNTER — Encounter: Payer: Self-pay | Admitting: Obstetrics

## 2013-08-01 ENCOUNTER — Ambulatory Visit (INDEPENDENT_AMBULATORY_CARE_PROVIDER_SITE_OTHER): Payer: Medicaid Other | Admitting: Obstetrics

## 2013-08-01 VITALS — BP 130/82 | HR 80 | Temp 97.2°F | Ht 60.0 in | Wt 147.0 lb

## 2013-08-01 DIAGNOSIS — N760 Acute vaginitis: Secondary | ICD-10-CM | POA: Insufficient documentation

## 2013-08-01 DIAGNOSIS — Z0000 Encounter for general adult medical examination without abnormal findings: Secondary | ICD-10-CM

## 2013-08-01 LAB — POCT URINALYSIS DIPSTICK
Bilirubin, UA: NEGATIVE
Blood, UA: NEGATIVE
Leukocytes, UA: NEGATIVE
Nitrite, UA: NEGATIVE
pH, UA: 5

## 2013-08-01 NOTE — Progress Notes (Signed)
Subjective:     Mackenzie Pierce is a 32 y.o. female here for a routine exam.  Current complaints: annual exam and screening. Pt. Reports having discharge and odor. Is concerned that she may have bacterial vaginosis or urinary concerns.  Personal health questionnaire reviewed: yes.   Gynecologic History Patient's last menstrual period was 07/22/2013. Contraception: IUD Last Pap: October 2013. Results were: normal   Obstetric History OB History  Gravida Para Term Preterm AB SAB TAB Ectopic Multiple Living  2 2 2  0 0 0 0 0 0 2    # Outcome Date GA Lbr Len/2nd Weight Sex Delivery Anes PTL Lv  2 TRM      LTCS   Y     Comments: repeat c/section  1 TRM      LTCS   Y     Comments: failure to progress       The following portions of the patient's history were reviewed and updated as appropriate: allergies, current medications, past family history, past medical history, past social history, past surgical history and problem list.  Review of Systems Pertinent items are noted in HPI.    Objective:    General appearance: alert and no distress Breasts: normal appearance, no masses or tenderness Abdomen: normal findings: soft, non-tender Pelvic: cervix normal in appearance, external genitalia normal, no adnexal masses or tenderness, no cervical motion tenderness, rectovaginal septum normal, uterus normal size, shape, and consistency and vagina normal without discharge    Assessment:    Healthy female exam.    Plan:    Education reviewed: safe sex/STD prevention.    F/U 1 year.

## 2013-08-01 NOTE — Addendum Note (Signed)
Addended by: Elby Beck F on: 08/01/2013 05:14 PM   Modules accepted: Orders

## 2013-08-02 ENCOUNTER — Other Ambulatory Visit: Payer: Self-pay | Admitting: *Deleted

## 2013-08-02 DIAGNOSIS — B9689 Other specified bacterial agents as the cause of diseases classified elsewhere: Secondary | ICD-10-CM

## 2013-08-02 LAB — WET PREP BY MOLECULAR PROBE: Gardnerella vaginalis: POSITIVE — AB

## 2013-08-02 MED ORDER — METRONIDAZOLE 500 MG PO TABS: 500.0000 mg | ORAL_TABLET | Freq: Two times a day (BID) | ORAL | Status: DC

## 2013-08-03 LAB — PAP IG, CT-NG, RFX HPV ASCU
Chlamydia Probe Amp: NEGATIVE
GC Probe Amp: NEGATIVE

## 2014-07-17 ENCOUNTER — Encounter: Payer: Self-pay | Admitting: Obstetrics

## 2014-08-01 ENCOUNTER — Ambulatory Visit: Payer: Medicaid Other | Admitting: Obstetrics

## 2014-08-15 ENCOUNTER — Ambulatory Visit: Payer: Medicaid Other | Admitting: Obstetrics

## 2014-10-26 ENCOUNTER — Encounter (HOSPITAL_COMMUNITY): Payer: Self-pay | Admitting: Emergency Medicine

## 2014-10-26 ENCOUNTER — Emergency Department (HOSPITAL_COMMUNITY)
Admission: EM | Admit: 2014-10-26 | Discharge: 2014-10-26 | Disposition: A | Discharge status: Home / Self Care | Payer: No Typology Code available for payment source | Attending: Emergency Medicine | Admitting: Emergency Medicine

## 2014-10-26 DIAGNOSIS — S3992XA Unspecified injury of lower back, initial encounter: Secondary | ICD-10-CM | POA: Diagnosis not present

## 2014-10-26 DIAGNOSIS — S4992XA Unspecified injury of left shoulder and upper arm, initial encounter: Secondary | ICD-10-CM | POA: Diagnosis not present

## 2014-10-26 DIAGNOSIS — Y9389 Activity, other specified: Secondary | ICD-10-CM | POA: Diagnosis not present

## 2014-10-26 DIAGNOSIS — S161XXA Strain of muscle, fascia and tendon at neck level, initial encounter: Secondary | ICD-10-CM | POA: Diagnosis not present

## 2014-10-26 DIAGNOSIS — Y92410 Unspecified street and highway as the place of occurrence of the external cause: Secondary | ICD-10-CM | POA: Insufficient documentation

## 2014-10-26 DIAGNOSIS — Y998 Other external cause status: Secondary | ICD-10-CM | POA: Insufficient documentation

## 2014-10-26 DIAGNOSIS — S199XXA Unspecified injury of neck, initial encounter: Secondary | ICD-10-CM | POA: Diagnosis present

## 2014-10-26 DIAGNOSIS — Z792 Long term (current) use of antibiotics: Secondary | ICD-10-CM | POA: Insufficient documentation

## 2014-10-26 MED ORDER — DIAZEPAM 5 MG PO TABS: 5.0000 mg | ORAL_TABLET | Freq: Three times a day (TID) | ORAL | Status: DC | PRN
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
## 2014-10-26 NOTE — Discharge Instructions (Signed)
Cervical Strain and Sprain (Whiplash) with Rehab Cervical strain and sprain are injuries that commonly occur with "whiplash" injuries. Whiplash occurs when the neck is forcefully whipped backward or forward, such as during a motor vehicle accident or during contact sports. The muscles, ligaments, tendons, discs, and nerves of the neck are susceptible to injury when this occurs. RISK FACTORS Risk of having a whiplash injury increases if:  Osteoarthritis of the spine.  Situations that make head or neck accidents or trauma more likely.  High-risk sports (football, rugby, wrestling, hockey, auto racing, gymnastics, diving, contact karate, or boxing).  Poor strength and flexibility of the neck.  Previous neck injury.  Poor tackling technique.  Improperly fitted or padded equipment. SYMPTOMS   Pain or stiffness in the front or back of neck or both.  Symptoms may present immediately or up to 24 hours after injury.  Dizziness, headache, nausea, and vomiting.  Muscle spasm with soreness and stiffness in the neck.  Tenderness and swelling at the injury site. PREVENTION  Learn and use proper technique (avoid tackling with the head, spearing, and head-butting; use proper falling techniques to avoid landing on the head).  Warm up and stretch properly before activity.  Maintain physical fitness:  Strength, flexibility, and endurance.  Cardiovascular fitness.  Wear properly fitted and padded protective equipment, such as padded soft collars, for participation in contact sports. PROGNOSIS  Recovery from cervical strain and sprain injuries is dependent on the extent of the injury. These injuries are usually curable in 1 week to 3 months with appropriate treatment.  RELATED COMPLICATIONS   Temporary numbness and weakness may occur if the nerve roots are damaged, and this may persist until the nerve has completely healed.  Chronic pain due to frequent recurrence of  symptoms.  Prolonged healing, especially if activity is resumed too soon (before complete recovery). TREATMENT  Treatment initially involves the use of ice and medication to help reduce pain and inflammation. It is also important to perform strengthening and stretching exercises and modify activities that worsen symptoms so the injury does not get worse. These exercises may be performed at home or with a therapist. For patients who experience severe symptoms, a soft, padded collar may be recommended to be worn around the neck.  Improving your posture may help reduce symptoms. Posture improvement includes pulling your chin and abdomen in while sitting or standing. If you are sitting, sit in a firm chair with your buttocks against the back of the chair. While sleeping, try replacing your pillow with a small towel rolled to 2 inches in diameter, or use a cervical pillow or soft cervical collar. Poor sleeping positions delay healing.  For patients with nerve root damage, which causes numbness or weakness, the use of a cervical traction apparatus may be recommended. Surgery is rarely necessary for these injuries. However, cervical strain and sprains that are present at birth (congenital) may require surgery. MEDICATION   If pain medication is necessary, nonsteroidal anti-inflammatory medications, such as aspirin and ibuprofen, or other minor pain relievers, such as acetaminophen, are often recommended.  Do not take pain medication for 7 days before surgery.  Prescription pain relievers may be given if deemed necessary by your caregiver. Use only as directed and only as much as you need. HEAT AND COLD:   Cold treatment (icing) relieves pain and reduces inflammation. Cold treatment should be applied for 10 to 15 minutes every 2 to 3 hours for inflammation and pain and immediately after any activity that aggravates   your symptoms. Use ice packs or an ice massage.  Heat treatment may be used prior to  performing the stretching and strengthening activities prescribed by your caregiver, physical therapist, or athletic trainer. Use a heat pack or a warm soak. SEEK MEDICAL CARE IF:   Symptoms get worse or do not improve in 2 weeks despite treatment.  New, unexplained symptoms develop (drugs used in treatment may produce side effects). EXERCISES RANGE OF MOTION (ROM) AND STRETCHING EXERCISES - Cervical Strain and Sprain These exercises may help you when beginning to rehabilitate your injury. In order to successfully resolve your symptoms, you must improve your posture. These exercises are designed to help reduce the forward-head and rounded-shoulder posture which contributes to this condition. Your symptoms may resolve with or without further involvement from your physician, physical therapist or athletic trainer. While completing these exercises, remember:   Restoring tissue flexibility helps normal motion to return to the joints. This allows healthier, less painful movement and activity.  An effective stretch should be held for at least 20 seconds, although you may need to begin with shorter hold times for comfort.  A stretch should never be painful. You should only feel a gentle lengthening or release in the stretched tissue. STRETCH- Axial Extensors  Lie on your back on the floor. You may bend your knees for comfort. Place a rolled-up hand towel or dish towel, about 2 inches in diameter, under the part of your head that makes contact with the floor.  Gently tuck your chin, as if trying to make a "double chin," until you feel a gentle stretch at the base of your head.  Hold __________ seconds. Repeat __________ times. Complete this exercise __________ times per day.  STRETCH - Axial Extension   Stand or sit on a firm surface. Assume a good posture: chest up, shoulders drawn back, abdominal muscles slightly tense, knees unlocked (if standing) and feet hip width apart.  Slowly retract your  chin so your head slides back and your chin slightly lowers. Continue to look straight ahead.  You should feel a gentle stretch in the back of your head. Be certain not to feel an aggressive stretch since this can cause headaches later.  Hold for __________ seconds. Repeat __________ times. Complete this exercise __________ times per day. STRETCH - Cervical Side Bend   Stand or sit on a firm surface. Assume a good posture: chest up, shoulders drawn back, abdominal muscles slightly tense, knees unlocked (if standing) and feet hip width apart.  Without letting your nose or shoulders move, slowly tip your right / left ear to your shoulder until your feel a gentle stretch in the muscles on the opposite side of your neck.  Hold __________ seconds. Repeat __________ times. Complete this exercise __________ times per day. STRETCH - Cervical Rotators   Stand or sit on a firm surface. Assume a good posture: chest up, shoulders drawn back, abdominal muscles slightly tense, knees unlocked (if standing) and feet hip width apart.  Keeping your eyes level with the ground, slowly turn your head until you feel a gentle stretch along the back and opposite side of your neck.  Hold __________ seconds. Repeat __________ times. Complete this exercise __________ times per day. RANGE OF MOTION - Neck Circles   Stand or sit on a firm surface. Assume a good posture: chest up, shoulders drawn back, abdominal muscles slightly tense, knees unlocked (if standing) and feet hip width apart.  Gently roll your head down and around from the   back of one shoulder to the back of the other. The motion should never be forced or painful.  Repeat the motion 10-20 times, or until you feel the neck muscles relax and loosen. Repeat __________ times. Complete the exercise __________ times per day. STRENGTHENING EXERCISES - Cervical Strain and Sprain These exercises may help you when beginning to rehabilitate your injury. They may  resolve your symptoms with or without further involvement from your physician, physical therapist, or athletic trainer. While completing these exercises, remember:   Muscles can gain both the endurance and the strength needed for everyday activities through controlled exercises.  Complete these exercises as instructed by your physician, physical therapist, or athletic trainer. Progress the resistance and repetitions only as guided.  You may experience muscle soreness or fatigue, but the pain or discomfort you are trying to eliminate should never worsen during these exercises. If this pain does worsen, stop and make certain you are following the directions exactly. If the pain is still present after adjustments, discontinue the exercise until you can discuss the trouble with your clinician. STRENGTH - Cervical Flexors, Isometric  Face a wall, standing about 6 inches away. Place a small pillow, a ball about 6-8 inches in diameter, or a folded towel between your forehead and the wall.  Slightly tuck your chin and gently push your forehead into the soft object. Push only with mild to moderate intensity, building up tension gradually. Keep your jaw and forehead relaxed.  Hold 10 to 20 seconds. Keep your breathing relaxed.  Release the tension slowly. Relax your neck muscles completely before you start the next repetition. Repeat __________ times. Complete this exercise __________ times per day. STRENGTH- Cervical Lateral Flexors, Isometric   Stand about 6 inches away from a wall. Place a small pillow, a ball about 6-8 inches in diameter, or a folded towel between the side of your head and the wall.  Slightly tuck your chin and gently tilt your head into the soft object. Push only with mild to moderate intensity, building up tension gradually. Keep your jaw and forehead relaxed.  Hold 10 to 20 seconds. Keep your breathing relaxed.  Release the tension slowly. Relax your neck muscles completely  before you start the next repetition. Repeat __________ times. Complete this exercise __________ times per day. STRENGTH - Cervical Extensors, Isometric   Stand about 6 inches away from a wall. Place a small pillow, a ball about 6-8 inches in diameter, or a folded towel between the back of your head and the wall.  Slightly tuck your chin and gently tilt your head back into the soft object. Push only with mild to moderate intensity, building up tension gradually. Keep your jaw and forehead relaxed.  Hold 10 to 20 seconds. Keep your breathing relaxed.  Release the tension slowly. Relax your neck muscles completely before you start the next repetition. Repeat __________ times. Complete this exercise __________ times per day. POSTURE AND BODY MECHANICS CONSIDERATIONS - Cervical Strain and Sprain Keeping correct posture when sitting, standing or completing your activities will reduce the stress put on different body tissues, allowing injured tissues a chance to heal and limiting painful experiences. The following are general guidelines for improved posture. Your physician or physical therapist will provide you with any instructions specific to your needs. While reading these guidelines, remember:  The exercises prescribed by your provider will help you have the flexibility and strength to maintain correct postures.  The correct posture provides the optimal environment for your joints to   work. All of your joints have less wear and tear when properly supported by a spine with good posture. This means you will experience a healthier, less painful body.  Correct posture must be practiced with all of your activities, especially prolonged sitting and standing. Correct posture is as important when doing repetitive low-stress activities (typing) as it is when doing a single heavy-load activity (lifting). PROLONGED STANDING WHILE SLIGHTLY LEANING FORWARD When completing a task that requires you to lean  forward while standing in one place for a long time, place either foot up on a stationary 2- to 4-inch high object to help maintain the best posture. When both feet are on the ground, the low back tends to lose its slight inward curve. If this curve flattens (or becomes too large), then the back and your other joints will experience too much stress, fatigue more quickly, and can cause pain.  RESTING POSITIONS Consider which positions are most painful for you when choosing a resting position. If you have pain with flexion-based activities (sitting, bending, stooping, squatting), choose a position that allows you to rest in a less flexed posture. You would want to avoid curling into a fetal position on your side. If your pain worsens with extension-based activities (prolonged standing, working overhead), avoid resting in an extended position such as sleeping on your stomach. Most people will find more comfort when they rest with their spine in a more neutral position, neither too rounded nor too arched. Lying on a non-sagging bed on your side with a pillow between your knees, or on your back with a pillow under your knees will often provide some relief. Keep in mind, being in any one position for a prolonged period of time, no matter how correct your posture, can still lead to stiffness. WALKING Walk with an upright posture. Your ears, shoulders, and hips should all line up. OFFICE WORK When working at a desk, create an environment that supports good, upright posture. Without extra support, muscles fatigue and lead to excessive strain on joints and other tissues. CHAIR:  A chair should be able to slide under your desk when your back makes contact with the back of the chair. This allows you to work closely.  The chair's height should allow your eyes to be level with the upper part of your monitor and your hands to be slightly lower than your elbows.  Body position:  Your feet should make contact with the  floor. If this is not possible, use a foot rest.  Keep your ears over your shoulders. This will reduce stress on your neck and low back. Document Released: 09/01/2005 Document Revised: 01/16/2014 Document Reviewed: 12/14/2008 [employer] Patient Information 2015 [employer]. This information is not intended to replace advice given to you by your health care provider. Make sure you discuss any questions you have with your health care provider.  
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
## 2015-11-14 DIAGNOSIS — Z029 Encounter for administrative examinations, unspecified: Secondary | ICD-10-CM

## 2015-12-06 ENCOUNTER — Encounter: Payer: Self-pay | Admitting: Obstetrics

## 2015-12-06 ENCOUNTER — Ambulatory Visit (INDEPENDENT_AMBULATORY_CARE_PROVIDER_SITE_OTHER): Payer: Medicaid Other | Admitting: Obstetrics

## 2015-12-06 VITALS — BP 129/82 | HR 62 | Wt 141.0 lb

## 2015-12-06 DIAGNOSIS — Z3049 Encounter for surveillance of other contraceptives: Secondary | ICD-10-CM

## 2015-12-06 DIAGNOSIS — B9689 Other specified bacterial agents as the cause of diseases classified elsewhere: Secondary | ICD-10-CM

## 2015-12-06 DIAGNOSIS — Z3040 Encounter for surveillance of contraceptives, unspecified: Secondary | ICD-10-CM

## 2015-12-06 DIAGNOSIS — N760 Acute vaginitis: Secondary | ICD-10-CM

## 2015-12-06 DIAGNOSIS — Z01419 Encounter for gynecological examination (general) (routine) without abnormal findings: Secondary | ICD-10-CM

## 2015-12-06 MED ORDER — METRONIDAZOLE 500 MG PO TABS: 500.0000 mg | ORAL_TABLET | Freq: Two times a day (BID) | ORAL | Status: DC

## 2015-12-06 NOTE — Progress Notes (Signed)
Subjective:        Mackenzie Pierce is a 35 y.o. female here for a routine exam.  Current complaints: None.    Personal health questionnaire:  Is patient Mackenzie Pierce, have a family history of breast and/or ovarian cancer: no Is there a family history of uterine cancer diagnosed at age < 86, gastrointestinal cancer, urinary tract cancer, family member who is a Field seismologist syndrome-associated carrier: no Is the patient overweight and hypertensive, family history of diabetes, personal history of gestational diabetes, preeclampsia or PCOS: no Is patient over 29, have PCOS,  family history of premature CHD under age 50, diabetes, smoke, have hypertension or peripheral artery disease:  no At any time, has a partner hit, kicked or otherwise hurt or frightened you?: no Over the past 2 weeks, have you felt down, depressed or hopeless?: no Over the past 2 weeks, have you felt little interest or pleasure in doing things?:no   Gynecologic History No LMP recorded. Patient is not currently having periods (Reason: IUD). Contraception: IUD Last Pap: 2014. Results were: normal Last mammogram: n/a. Results were: n/a  Obstetric History OB History  Gravida Para Term Preterm AB SAB TAB Ectopic Multiple Living  2 2 2  0 0 0 0 0 0 2    # Outcome Date GA Lbr Len/2nd Weight Sex Delivery Anes PTL Lv  2 Term      CS-LTranv   Y     Comments: repeat c/section  1 Term      CS-LTranv   Y     Comments: failure to progress      Past Medical History  Diagnosis Date  . No pertinent past medical history     Past Surgical History  Procedure Laterality Date  . Cesarean section       Current outpatient prescriptions:  .  cetirizine (ZYRTEC) 10 MG tablet, Take 10 mg by mouth daily., Disp: , Rfl:  .  diazepam (VALIUM) 5 MG tablet, Take 1 tablet (5 mg total) by mouth every 8 (eight) hours as needed for anxiety., Disp: 10 tablet, Rfl: 0 .  levonorgestrel (MIRENA) 20 MCG/24HR IUD, 1 each by Intrauterine route  once., Disp: , Rfl:  Allergies  Allergen Reactions  . Pineapple Swelling    Lip swelling  . Milk-Related Compounds Hives  . Orange Concentrate [Flavoring Agent] Hives  . Tylenol [Acetaminophen] Swelling    Any tylenol based medication makes her swell    Social History  Substance Use Topics  . Smoking status: Never Smoker   . Smokeless tobacco: Never Used  . Alcohol Use: No    Family History  Problem Relation Age of Onset  . Anesthesia problems Neg Hx   . Hypotension Neg Hx   . Malignant hyperthermia Neg Hx   . Pseudochol deficiency Neg Hx       Review of Systems  Constitutional: negative for fatigue and weight loss Respiratory: negative for cough and wheezing Cardiovascular: negative for chest pain, fatigue and palpitations Gastrointestinal: negative for abdominal pain and change in bowel habits Musculoskeletal:negative for myalgias Neurological: negative for gait problems and tremors Behavioral/Psych: negative for abusive relationship, depression Endocrine: negative for temperature intolerance   Genitourinary:negative for abnormal menstrual periods, genital lesions, hot flashes, sexual problems and vaginal discharge Integument/breast: negative for breast lump, breast tenderness, nipple discharge and skin lesion(s)    Objective:       BP 129/82 mmHg  Pulse 62  Wt 141 lb (63.957 kg) General:   alert  Skin:  no rash or abnormalities  Lungs:   clear to auscultation bilaterally  Heart:   regular rate and rhythm, S1, S2 normal, no murmur, click, rub or gallop  Breasts:   normal without suspicious masses, skin or nipple changes or axillary nodes  Abdomen:  normal findings: no organomegaly, soft, non-tender and no hernia  Pelvis:  External genitalia: normal general appearance Urinary system: urethral meatus normal and bladder without fullness, nontender Vaginal: normal without tenderness, induration or masses Cervix: normal appearance Adnexa: normal bimanual  exam Uterus: anteverted and non-tender, normal size   Lab Review Urine pregnancy test Labs reviewed no Radiologic studies reviewed no   Assessment:    Healthy female exam.    BV  Contraceptive Surveillance.  IUD in place.   Plan:   Wet prep done Flagyl Rx   Education reviewed: calcium supplements, low fat, low cholesterol diet, safe sex/STD prevention, self breast exams and weight bearing exercise. Contraception: IUD. Follow up in: 1 year.   Meds ordered this encounter  Medications  . cetirizine (ZYRTEC) 10 MG tablet    Sig: Take 10 mg by mouth daily.   No orders of the defined types were placed in this encounter.

## 2015-12-10 LAB — PAP IG (IMAGE GUIDED): PAP Smear Comment: 0

## 2016-08-05 ENCOUNTER — Encounter (HOSPITAL_COMMUNITY): Payer: Self-pay | Admitting: Emergency Medicine

## 2016-08-05 ENCOUNTER — Ambulatory Visit (HOSPITAL_COMMUNITY)
Admission: EM | Admit: 2016-08-05 | Discharge: 2016-08-05 | Disposition: A | Discharge status: Home / Self Care | Payer: BC Managed Care – PPO | Attending: Emergency Medicine | Admitting: Emergency Medicine

## 2016-08-05 DIAGNOSIS — S91331A Puncture wound without foreign body, right foot, initial encounter: Secondary | ICD-10-CM

## 2016-08-05 DIAGNOSIS — W57XXXA Bitten or stung by nonvenomous insect and other nonvenomous arthropods, initial encounter: Secondary | ICD-10-CM

## 2016-08-05 MED ORDER — CEPHALEXIN 500 MG PO CAPS: 500.0000 mg | ORAL_CAPSULE | Freq: Four times a day (QID) | ORAL | 0 refills | Status: DC

## 2016-08-05 MED ORDER — PREDNISONE 50 MG PO TABS: ORAL_TABLET | ORAL | 0 refills | Status: DC

## 2016-08-05 NOTE — Discharge Instructions (Signed)
The exact mechanism of skin injury is unknown. This certainly may be a type of insect bite similar to a bee sting or fire ant. Continue to apply the triamcinolone twice a day. May also apply Benadryl cream. Take Benadryl 25-50 mg every 4-6 hours as needed for itching. Also apply cold compresses off and on during the day. Take the prednisone as directed and with food once a day. For worsening or new symptoms or problems seek medical attention promptly.

## 2016-08-05 NOTE — ED Provider Notes (Signed)
CSN: [id number]     Arrival date & time 08/05/16  1006 History   First MD Initiated Contact with Patient 08/05/16 1146     Chief Complaint  Patient presents with  . Insect Bite   (Consider location/radiation/quality/duration/timing/severity/associated sxs/prior Treatment) 35 year old female states she was [place] [employer] and walking by some boxes when she felt what she believed to be bites to the feet. The first bite was to the anterior ankle which has formed a small vesicle. The second 2 bites are to the right foot 1 to the medial aspect of the foot and one over the great toe. These are areas with a small punctate lesion with surrounding erythema. She states they itch and hurt. Denies systemic symptoms. She has tried putting hydrocortisone cream on her foot later stating that it must have been triamcinolone cream. She states that helps a little for short period of time      Past Medical History:  Diagnosis Date  . No pertinent past medical history    Past Surgical History:  Procedure Laterality Date  . CESAREAN SECTION     Family History  Problem Relation Age of Onset  . Anesthesia problems Neg Hx   . Hypotension Neg Hx   . Malignant hyperthermia Neg Hx   . Pseudochol deficiency Neg Hx    Social History  Substance Use Topics  . Smoking status: Never Smoker  . Smokeless tobacco: Never Used  . Alcohol use No   OB History    Gravida Para Term Preterm AB Living   2 2 2  0 0 2   SAB TAB Ectopic Multiple Live Births   0 0 0 0 2     Review of Systems  Constitutional: Negative.   HENT: Negative.   Respiratory: Negative.   Cardiovascular: Negative.   Gastrointestinal: Negative.   Skin:       As per history of present illness  Neurological: Negative.   All other systems reviewed and are negative.   Allergies  Pineapple; Milk-related compounds; Orange concentrate [flavoring agent]; and Tylenol [acetaminophen]  Home Medications   Prior to Admission medications    Medication Sig Start Date End Date Taking? Authorizing Provider  cetirizine (ZYRTEC) 10 MG tablet Take 10 mg by mouth daily.   Yes Historical Provider, MD  levonorgestrel (MIRENA) 20 MCG/24HR IUD 1 each by Intrauterine route once.   Yes Historical Provider, MD  cephALEXin (KEFLEX) 500 MG capsule Take 1 capsule (500 mg total) by mouth 4 (four) times daily. 08/05/16   Janne Napoleon, NP  predniSONE (DELTASONE) 50 MG tablet 1 tab po daily for 4 days. Take with food. 08/05/16   Janne Napoleon, NP   Meds Ordered and Administered this Visit  Medications - No data to display  BP 139/94 (BP Location: Left Arm)   Pulse 82   Temp 97.5 F (36.4 C) (Oral)   SpO2 100%  No data found.   Physical Exam  Constitutional: She is oriented to person, place, and time. She appears well-developed and well-nourished. No distress.  HENT:  Head: Normocephalic and atraumatic.  Right Ear: External ear normal.  Left Ear: External ear normal.  Mouth/Throat: Oropharynx is clear and moist.  Eyes: EOM are normal.  Neck: Normal range of motion. Neck supple.  Cardiovascular: Normal rate and regular rhythm.   Pulmonary/Chest: Effort normal.  Musculoskeletal: Normal range of motion.  Neurological: She is alert and oriented to person, place, and time.  Skin: Skin is warm and dry. No rash noted.  The left anterior ankle with vesicle formation measuring approximately 1 cm across. Minimal erythema. No lymphangitis. No exudates, draining or bleeding. No erythema into the foot.  The right medial foot with a small punctate lesion, surrounding erythema for approximately one and a half centimeters with local tenderness. Smaller and similar lesion to the extensor surface of the right great toe. No other lesions are seen. All of these lesions with itching, pain and tenderness. No generalized rash.  Psychiatric: She has a normal mood and affect.  Nursing note and vitals reviewed.   Urgent Care Course   Clinical Course      Procedures (including critical care time)  Labs Review Labs Reviewed - No data to display  Imaging Review No results found.   Visual Acuity Review  Right Eye Distance:   Left Eye Distance:   Bilateral Distance:    Right Eye Near:   Left Eye Near:    Bilateral Near:         MDM   1. Insect bite, initial encounter    The exact mechanism of skin injury is unknown. This certainly may be a type of insect bite similar to a bee sting or fire ant. Continue to apply the triamcinolone twice a day. May also apply Benadryl cream. Take Benadryl 25-50 mg every 4-6 hours as needed for itching. Also apply cold compresses off and on during the day. Take the prednisone as directed and with food once a day. For worsening or new symptoms or problems seek medical attention promptly. Meds ordered this encounter  Medications  . predniSONE (DELTASONE) 50 MG tablet    Sig: 1 tab po daily for 4 days. Take with food.    Dispense:  4 tablet    Refill:  0    Order Specific Question:   Supervising Provider    Answer:   Melony Overly [id number]  . cephALEXin (KEFLEX) 500 MG capsule    Sig: Take 1 capsule (500 mg total) by mouth 4 (four) times daily.    Dispense:  16 capsule    Refill:  0    Order Specific Question:   Supervising Provider    Answer:   Melony Overly [phone]  Although there is no clear evidence of infection at this time, the amount of tenderness and erythema particularly that associated with the vesicle may be an early indicator of secondary infection. We will treat with the antibiotic primarily prophylactically.      Janne Napoleon, NP 08/05/16 [id number]    Janne Napoleon, NP 08/05/16 [phone]

## 2016-08-05 NOTE — ED Triage Notes (Addendum)
Pt states she walked by a box yesterday and immediately started itching on her left leg.  By night time she had two blisters form on her left leg and two on her right foot.  She states they were burning and felt like there was a lot of pressure under them.  She tried to drain them to relieve the pressure.  Pt states the same thing happened to her about a month ago. She received treatment for it and was taking oral medication that cleared it up.  She has taken two of these this time with no relief.

## 2016-12-08 ENCOUNTER — Encounter: Payer: Self-pay | Admitting: Obstetrics

## 2016-12-08 ENCOUNTER — Ambulatory Visit (INDEPENDENT_AMBULATORY_CARE_PROVIDER_SITE_OTHER): Payer: BC Managed Care – PPO | Admitting: Obstetrics

## 2016-12-08 ENCOUNTER — Other Ambulatory Visit (HOSPITAL_COMMUNITY)
Admission: RE | Admit: 2016-12-08 | Discharge: 2016-12-08 | Disposition: A | Discharge status: Home / Self Care | Payer: BC Managed Care – PPO | Source: Ambulatory Visit | Attending: Obstetrics | Admitting: Obstetrics

## 2016-12-08 VITALS — BP 130/80 | HR 72 | Ht 59.0 in | Wt 142.0 lb

## 2016-12-08 DIAGNOSIS — Z124 Encounter for screening for malignant neoplasm of cervix: Secondary | ICD-10-CM | POA: Diagnosis not present

## 2016-12-08 DIAGNOSIS — N898 Other specified noninflammatory disorders of vagina: Secondary | ICD-10-CM

## 2016-12-08 DIAGNOSIS — Z113 Encounter for screening for infections with a predominantly sexual mode of transmission: Secondary | ICD-10-CM | POA: Diagnosis present

## 2016-12-08 DIAGNOSIS — Z0000 Encounter for general adult medical examination without abnormal findings: Secondary | ICD-10-CM | POA: Diagnosis not present

## 2016-12-08 DIAGNOSIS — Z01419 Encounter for gynecological examination (general) (routine) without abnormal findings: Secondary | ICD-10-CM | POA: Diagnosis not present

## 2016-12-08 NOTE — Progress Notes (Signed)
Pt c/o annual, pap, and STD testing. Pt requests IUD string check.

## 2016-12-08 NOTE — Progress Notes (Signed)
Subjective:        Mackenzie Pierce is a 36 y.o. female here for a routine exam.  Current complaints: Bumps in vulva area that come up with period sometimes.  These bumps occur in same location and aren't painful.  Personal health questionnaire:  Is patient [name], have a family history of breast and/or ovarian cancer: no Is there a family history of uterine cancer diagnosed at age < 50, gastrointestinal cancer, urinary tract cancer, family member who is a Field seismologist syndrome-associated carrier: no Is the patient overweight and hypertensive, family history of diabetes, personal history of gestational diabetes, preeclampsia or PCOS: no Is patient over 51, have PCOS,  family history of premature CHD under age 1, diabetes, smoke, have hypertension or peripheral artery disease:  no At any time, has a partner hit, kicked or otherwise hurt or frightened you?: no Over the past 2 weeks, have you felt down, depressed or hopeless?: no Over the past 2 weeks, have you felt little interest or pleasure in doing things?:no   Gynecologic History No LMP recorded. Patient is not currently having periods (Reason: IUD). Contraception: IUD Last Pap: 2017. Results were: normal Last mammogram: n/a. Results were: n/a  Obstetric History OB History  Gravida Para Term Preterm AB Living  2 2 2  0 0 2  SAB TAB Ectopic Multiple Live Births  0 0 0 0 2    # Outcome Date GA Lbr Len/2nd Weight Sex Delivery Anes PTL Lv  2 Term      CS-LTranv   LIV     Birth Comments: repeat c/section  1 Term      CS-LTranv   LIV     Birth Comments: failure to progress      Past Medical History:  Diagnosis Date  . Hypoglycemia   . No pertinent past medical history     Past Surgical History:  Procedure Laterality Date  . CESAREAN SECTION     x2     Current Outpatient Prescriptions:  .  levonorgestrel (MIRENA) 20 MCG/24HR IUD, 1 each by Intrauterine route once., Disp: , Rfl:  .  cephALEXin (KEFLEX) 500 MG  capsule, Take 1 capsule (500 mg total) by mouth 4 (four) times daily. (Patient not taking: Reported on 12/08/2016), Disp: 16 capsule, Rfl: 0 .  cetirizine (ZYRTEC) 10 MG tablet, Take 10 mg by mouth daily., Disp: , Rfl:  .  predniSONE (DELTASONE) 50 MG tablet, 1 tab po daily for 4 days. Take with food. (Patient not taking: Reported on 12/08/2016), Disp: 4 tablet, Rfl: 0 Allergies  Allergen Reactions  . Pineapple Swelling    Lip swelling  . Milk-Related Compounds Hives  . Orange Concentrate [Flavoring Agent] Hives  . Tylenol [Acetaminophen] Swelling    Any tylenol based medication makes her swell    Social History  Substance Use Topics  . Smoking status: Never Smoker  . Smokeless tobacco: Never Used  . Alcohol use No    Family History  Problem Relation Age of Onset  . Diabetes Mother   . Heart murmur Mother   . Diabetes type II Father   . Lymphoma Father   . HIV Father   . Anesthesia problems Neg Hx   . Hypotension Neg Hx   . Malignant hyperthermia Neg Hx   . Pseudochol deficiency Neg Hx       Review of Systems  Constitutional: negative for fatigue and weight loss Respiratory: negative for cough and wheezing Cardiovascular: negative for chest pain, fatigue and palpitations  Gastrointestinal: negative for abdominal pain and change in bowel habits Musculoskeletal:negative for myalgias Neurological: negative for gait problems and tremors Behavioral/Psych: negative for abusive relationship, depression Endocrine: negative for temperature intolerance    Genitourinary:positive for vulva bumps Integument/breast: negative for breast lump, breast tenderness, nipple discharge and skin lesion(s)    Objective:       BP (!) 157/92   Pulse 72   Ht 4\' 11"  (1.499 m)   Wt 142 lb (64.4 kg)   BMI 28.68 kg/m  General:   alert  Skin:   no rash or abnormalities  Lungs:   clear to auscultation bilaterally  Heart:   regular rate and rhythm, S1, S2 normal, no murmur, click, rub or gallop   Breasts:   normal without suspicious masses, skin or nipple changes or axillary nodes  Abdomen:  normal findings: no organomegaly, soft, non-tender and no hernia  Pelvis:  External genitalia: cluster of papules upper vulva near clitoris, nontender Urinary system: urethral meatus normal and bladder without fullness, nontender Vaginal: normal without tenderness, induration or masses Cervix: normal appearance Adnexa: normal bimanual exam Uterus: anteverted and non-tender, normal size   Lab Review Urine pregnancy test Labs reviewed yes Radiologic studies reviewed no  50% of 20 min visit spent on counseling and coordination of care.    Assessment:    Healthy female exam.    Vulva papules   Contraceptive Surveillance.  Pleased with IUD.   Plan:   HSV cultures done of vulva papules  Education reviewed: calcium supplements, depression evaluation, low fat, low cholesterol diet, safe sex/STD prevention, self breast exams and weight bearing exercise. Contraception: IUD. Follow up in: 1 year.   No orders of the defined types were placed in this encounter.  Orders Placed This Encounter  Procedures  . Hepatitis B surface antigen  . Hepatitis C antibody  . HIV antibody  . RPR      Patient ID: Mackenzie Pierce, female   DOB: 06-04-1981, 36 y.o.   MRN: [id number]

## 2016-12-09 LAB — HEPATITIS C ANTIBODY: Hep C Virus Ab: <0.1 s/co ratio (ref 0.0–0.9)

## 2016-12-09 LAB — CERVICOVAGINAL ANCILLARY ONLY
BACTERIAL VAGINITIS: POSITIVE — AB
CANDIDA VAGINITIS: NEGATIVE
CHLAMYDIA, DNA PROBE: NEGATIVE
Neisseria Gonorrhea: NEGATIVE
TRICH (WINDOWPATH): NEGATIVE

## 2016-12-09 LAB — HEPATITIS B SURFACE ANTIGEN: HEP B S AG: NEGATIVE

## 2016-12-09 LAB — RPR: RPR: NONREACTIVE

## 2016-12-09 LAB — HIV ANTIBODY (ROUTINE TESTING W REFLEX): HIV Screen 4th Generation wRfx: NONREACTIVE

## 2016-12-10 ENCOUNTER — Other Ambulatory Visit: Payer: Self-pay | Admitting: Obstetrics

## 2016-12-10 DIAGNOSIS — B9689 Other specified bacterial agents as the cause of diseases classified elsewhere: Secondary | ICD-10-CM

## 2016-12-10 DIAGNOSIS — N760 Acute vaginitis: Principal | ICD-10-CM

## 2016-12-10 LAB — CYTOLOGY - PAP: HPV: NOT DETECTED

## 2016-12-10 MED ORDER — METRONIDAZOLE 500 MG PO TABS: 500.0000 mg | ORAL_TABLET | Freq: Two times a day (BID) | ORAL | 2 refills | Status: DC

## 2016-12-11 LAB — HERPES SIMPLEX VIRUS CULTURE

## 2017-12-09 ENCOUNTER — Ambulatory Visit (INDEPENDENT_AMBULATORY_CARE_PROVIDER_SITE_OTHER): Payer: BC Managed Care – PPO | Admitting: Obstetrics

## 2017-12-09 ENCOUNTER — Other Ambulatory Visit (HOSPITAL_COMMUNITY)
Admission: RE | Admit: 2017-12-09 | Discharge: 2017-12-09 | Disposition: A | Discharge status: Home / Self Care | Payer: BC Managed Care – PPO | Source: Ambulatory Visit | Attending: Obstetrics | Admitting: Obstetrics

## 2017-12-09 ENCOUNTER — Encounter: Payer: Self-pay | Admitting: Obstetrics

## 2017-12-09 VITALS — BP 136/91 | HR 71 | Wt 150.0 lb

## 2017-12-09 DIAGNOSIS — Z1151 Encounter for screening for human papillomavirus (HPV): Secondary | ICD-10-CM | POA: Diagnosis not present

## 2017-12-09 DIAGNOSIS — Z01419 Encounter for gynecological examination (general) (routine) without abnormal findings: Secondary | ICD-10-CM | POA: Diagnosis present

## 2017-12-09 DIAGNOSIS — Z01411 Encounter for gynecological examination (general) (routine) with abnormal findings: Secondary | ICD-10-CM | POA: Insufficient documentation

## 2017-12-09 DIAGNOSIS — N898 Other specified noninflammatory disorders of vagina: Secondary | ICD-10-CM | POA: Insufficient documentation

## 2017-12-09 DIAGNOSIS — Z3202 Encounter for pregnancy test, result negative: Secondary | ICD-10-CM

## 2017-12-09 DIAGNOSIS — T8332XA Displacement of intrauterine contraceptive device, initial encounter: Secondary | ICD-10-CM

## 2017-12-09 DIAGNOSIS — R87612 Low grade squamous intraepithelial lesion on cytologic smear of cervix (LGSIL): Secondary | ICD-10-CM

## 2017-12-09 DIAGNOSIS — Z113 Encounter for screening for infections with a predominantly sexual mode of transmission: Secondary | ICD-10-CM

## 2017-12-09 LAB — POCT URINE PREGNANCY: Preg Test, Ur: NEGATIVE

## 2017-12-09 NOTE — Addendum Note (Signed)
Addended by: Lucianne Lei on: 12/09/2017 10:23 AM   Modules accepted: Orders

## 2017-12-09 NOTE — Progress Notes (Signed)
Subjective:        Mackenzie Pierce is a 37 y.o. female here for a routine exam.  Current complaints: None.    Personal health questionnaire:  Is patient [name], have a family history of breast and/or ovarian cancer: no Is there a family history of uterine cancer diagnosed at age < 24, gastrointestinal cancer, urinary tract cancer, family member who is a Field seismologist syndrome-associated carrier: no Is the patient overweight and hypertensive, family history of diabetes, personal history of gestational diabetes, preeclampsia or PCOS: no Is patient over 40, have PCOS,  family history of premature CHD under age 34, diabetes, smoke, have hypertension or peripheral artery disease:  no At any time, has a partner hit, kicked or otherwise hurt or frightened you?: no Over the past 2 weeks, have you felt down, depressed or hopeless?: no Over the past 2 weeks, have you felt little interest or pleasure in doing things?:no   Gynecologic History No LMP recorded. (Menstrual status: IUD). Contraception: IUD Last Pap: 2018. Results were: LGSIL with negative High Risk HPV Last mammogram: n/a. Results were: n/a  Obstetric History OB History  Gravida Para Term Preterm AB Living  2 2 2  0 0 2  SAB TAB Ectopic Multiple Live Births  0 0 0 0 2    # Outcome Date GA Lbr Len/2nd Weight Sex Delivery Anes PTL Lv  2 Term      CS-LTranv   LIV     Birth Comments: repeat c/section  1 Term      CS-LTranv   LIV     Birth Comments: failure to progress    Past Medical History:  Diagnosis Date  . Hypoglycemia   . No pertinent past medical history     Past Surgical History:  Procedure Laterality Date  . CESAREAN SECTION     x2     Current Outpatient Medications:  .  cetirizine (ZYRTEC) 10 MG tablet, Take 10 mg by mouth daily., Disp: , Rfl:  .  levonorgestrel (MIRENA) 20 MCG/24HR IUD, 1 each by Intrauterine route once., Disp: , Rfl:  .  Multiple Vitamins-Minerals (MULTIVITAMIN GUMMIES ADULT PO),  Take by mouth., Disp: , Rfl:  Allergies  Allergen Reactions  . Pineapple Swelling    Lip swelling  . Milk-Related Compounds Hives  . Orange Concentrate [Flavoring Agent] Hives  . Tylenol [Acetaminophen] Swelling    Any tylenol based medication makes her swell    Social History   Tobacco Use  . Smoking status: Never Smoker  . Smokeless tobacco: Never Used  Substance Use Topics  . Alcohol use: Yes    Comment: occ    Family History  Problem Relation Age of Onset  . Diabetes Mother   . Heart murmur Mother   . Diabetes type II Father   . Lymphoma Father   . HIV Father   . Anesthesia problems Neg Hx   . Hypotension Neg Hx   . Malignant hyperthermia Neg Hx   . Pseudochol deficiency Neg Hx       Review of Systems  Constitutional: negative for fatigue and weight loss Respiratory: negative for cough and wheezing Cardiovascular: negative for chest pain, fatigue and palpitations Gastrointestinal: negative for abdominal pain and change in bowel habits Musculoskeletal:negative for myalgias Neurological: negative for gait problems and tremors Behavioral/Psych: negative for abusive relationship, depression Endocrine: negative for temperature intolerance    Genitourinary:negative for abnormal menstrual periods, genital lesions, hot flashes, sexual problems and vaginal discharge Integument/breast: negative for breast lump,  breast tenderness, nipple discharge and skin lesion(s)    Objective:       BP (!) 136/91   Pulse 71   Wt 150 lb (68 kg)   BMI 30.30 kg/m  General:   alert  Skin:   no rash or abnormalities  Lungs:   clear to auscultation bilaterally  Heart:   regular rate and rhythm, S1, S2 normal, no murmur, click, rub or gallop  Breasts:   normal without suspicious masses, skin or nipple changes or axillary nodes  Abdomen:  normal findings: no organomegaly, soft, non-tender and no hernia  Pelvis:  External genitalia: normal general appearance Urinary system: urethral  meatus normal and bladder without fullness, nontender Vaginal: normal without tenderness, induration or masses Cervix: normal appearance Adnexa: normal bimanual exam Uterus: anteverted and non-tender, normal size   Lab Review Urine pregnancy test Labs reviewed yes Radiologic studies reviewed no  50% of 20 min visit spent on counseling and coordination of care.   Assessment:     1. Encounter for routine gynecological examination with Papanicolaou smear of cervix Rx: - Cytology - PAP  2. Intrauterine contraceptive device threads lost, initial encounter Rx: - US PELVIC COMPLETE WITH TRANSVAGINAL; Future  3. Vaginal discharge Rx: - Cervicovaginal ancillary only  4. Screening examination for STD (sexually transmitted disease) Rx: - Hepatitis B surface antigen - Hepatitis C antibody - HIV antibody - RPR  5. LGSIL on Pap smear of cervix    Plan:    Education reviewed: calcium supplements, depression evaluation, low fat, low cholesterol diet, safe sex/STD prevention, self breast exams and weight bearing exercise. Contraception: IUD. Follow up in: 1 week.   No orders of the defined types were placed in this encounter.  Orders Placed This Encounter  Procedures  . US PELVIC COMPLETE WITH TRANSVAGINAL    Standing Status:   Future    Standing Expiration Date:   02/09/2019    Order Specific Question:   Reason for Exam (SYMPTOM  OR DIAGNOSIS REQUIRED)    Answer:   IUD Placement.  Lost IUD Strings.    Order Specific Question:   Preferred imaging location?    Answer:   [facility]  . Hepatitis B surface antigen  . Hepatitis C antibody  . HIV antibody  . RPR    Shelly Bombard MD 12-09-2017

## 2017-12-10 ENCOUNTER — Other Ambulatory Visit: Payer: Self-pay | Admitting: Obstetrics

## 2017-12-10 DIAGNOSIS — B9689 Other specified bacterial agents as the cause of diseases classified elsewhere: Secondary | ICD-10-CM

## 2017-12-10 DIAGNOSIS — N760 Acute vaginitis: Principal | ICD-10-CM

## 2017-12-10 LAB — HEPATITIS B SURFACE ANTIGEN: HEP B S AG: NEGATIVE

## 2017-12-10 LAB — CERVICOVAGINAL ANCILLARY ONLY
Bacterial vaginitis: POSITIVE — AB
CANDIDA VAGINITIS: NEGATIVE
Chlamydia: NEGATIVE
NEISSERIA GONORRHEA: NEGATIVE
TRICH (WINDOWPATH): NEGATIVE

## 2017-12-10 LAB — RPR: RPR: NONREACTIVE

## 2017-12-10 LAB — HIV ANTIBODY (ROUTINE TESTING W REFLEX): HIV Screen 4th Generation wRfx: NONREACTIVE

## 2017-12-10 LAB — HEPATITIS C ANTIBODY: Hep C Virus Ab: <0.1 s/co ratio (ref 0.0–0.9)

## 2017-12-10 MED ORDER — METRONIDAZOLE 500 MG PO TABS: 500.0000 mg | ORAL_TABLET | Freq: Two times a day (BID) | ORAL | 2 refills | Status: DC

## 2017-12-11 LAB — CYTOLOGY - PAP
DIAGNOSIS: NEGATIVE
HPV: NOT DETECTED

## 2017-12-15 ENCOUNTER — Ambulatory Visit (HOSPITAL_COMMUNITY)
Admission: RE | Admit: 2017-12-15 | Discharge: 2017-12-15 | Disposition: A | Discharge status: Home / Self Care | Payer: BC Managed Care – PPO | Source: Ambulatory Visit | Attending: Obstetrics | Admitting: Obstetrics

## 2017-12-15 DIAGNOSIS — T8332XA Displacement of intrauterine contraceptive device, initial encounter: Secondary | ICD-10-CM

## 2017-12-15 DIAGNOSIS — D259 Leiomyoma of uterus, unspecified: Secondary | ICD-10-CM | POA: Diagnosis not present

## 2017-12-15 DIAGNOSIS — Z975 Presence of (intrauterine) contraceptive device: Secondary | ICD-10-CM | POA: Insufficient documentation

## 2017-12-16 ENCOUNTER — Encounter: Payer: Self-pay | Admitting: Obstetrics

## 2017-12-16 ENCOUNTER — Ambulatory Visit (INDEPENDENT_AMBULATORY_CARE_PROVIDER_SITE_OTHER): Payer: BC Managed Care – PPO | Admitting: Obstetrics

## 2017-12-16 ENCOUNTER — Encounter: Payer: Self-pay | Admitting: *Deleted

## 2017-12-16 VITALS — BP 122/87 | HR 67 | Ht 59.0 in | Wt 150.6 lb

## 2017-12-16 DIAGNOSIS — Z30433 Encounter for removal and reinsertion of intrauterine contraceptive device: Secondary | ICD-10-CM | POA: Diagnosis not present

## 2017-12-16 DIAGNOSIS — N393 Stress incontinence (female) (male): Secondary | ICD-10-CM

## 2017-12-16 DIAGNOSIS — Z3202 Encounter for pregnancy test, result negative: Secondary | ICD-10-CM

## 2017-12-16 LAB — POCT URINE PREGNANCY: Preg Test, Ur: NEGATIVE

## 2017-12-16 MED ORDER — LEVONORGESTREL 20 MCG/24HR IU IUD
INTRAUTERINE_SYSTEM | Freq: Once | INTRAUTERINE | Status: AC
Start: 2017-12-16 — End: 2017-12-16
  Administered 2017-12-16: 10:00:00 via INTRAUTERINE

## 2017-12-16 NOTE — Progress Notes (Addendum)
    GYNECOLOGY OFFICE PROCEDURE NOTE  Mackenzie Pierce is a 37 y.o. [phone] here for [place] IUD removal and reinsertion. No GYN concerns.  Last pap smear was on 12-09-2017 and was normal.  IUD Removal and Reinsertion  Patient identified, informed consent performed, consent signed.   Discussed risks of irregular bleeding, cramping, infection, malpositioning or misplacement of the IUD outside the uterus which may require further procedures. Also advised to use backup contraception for one week as the risk of pregnancy is higher during the transition period of removing an IUD and replacing it with another one. Time out was performed. Speculum placed in the vagina. The strings of the IUD were grasped and pulled using ring forceps. The IUD was successfully removed in its entirety. The cervix was cleaned with Betadine x 2 and grasped anteriorly with a single tooth tenaculum.  The new [place]ta IUD insertion apparatus was used to sound the uterus to 8 cm;  the IUD was then placed per manufacturer's recommendations. Strings trimmed to 3 cm. Tenaculum was removed, good hemostasis noted. Patient tolerated procedure well.   Patient was given post-procedure instructions.  She was reminded to have backup contraception for one week during this transition period between IUDs.  Patient was also asked to check IUD strings periodically and follow up in 4 weeks for IUD check.  Lot #:  [id number] Exp. Date:  JUN  2021  Shelly Bombard MD, MD, [place], [facility] for [facility], [place]

## 2017-12-16 NOTE — Progress Notes (Signed)
Presents for Mirena removal and reinsert. Informed Consent signed. UPT today is Negative.

## 2018-01-27 ENCOUNTER — Encounter: Payer: Self-pay | Admitting: Obstetrics

## 2018-01-27 ENCOUNTER — Ambulatory Visit (INDEPENDENT_AMBULATORY_CARE_PROVIDER_SITE_OTHER): Payer: BC Managed Care – PPO | Admitting: Obstetrics

## 2018-01-27 VITALS — BP 115/78 | HR 93 | Wt 151.6 lb

## 2018-01-27 DIAGNOSIS — Z30431 Encounter for routine checking of intrauterine contraceptive device: Secondary | ICD-10-CM | POA: Diagnosis not present

## 2018-01-27 NOTE — Progress Notes (Signed)
Present for IUD String Check.

## 2018-01-27 NOTE — Progress Notes (Signed)
Subjective:    Mackenzie Pierce is a 37 y.o. female who presents for iud STRING CHECK. The patient has no complaints today. The patient is sexually active. Pertinent past medical history: none.  The information documented in the HPI was reviewed and verified.  Menstrual History: OB History    Gravida  2   Para  2   Term  2   Preterm  0   AB  0   Living  2     SAB  0   TAB  0   Ectopic  0   Multiple  0   Live Births  2            No LMP recorded. (Menstrual status: IUD).   Patient Active Problem List   Diagnosis Date Noted  . Vaginitis and vulvovaginitis, unspecified 08/01/2013  . Urinary tract infection, site not specified 12/13/2012   Past Medical History:  Diagnosis Date  . Hypoglycemia   . No pertinent past medical history     Past Surgical History:  Procedure Laterality Date  . CESAREAN SECTION     x2     Current Outpatient Medications:  .  cetirizine (ZYRTEC) 10 MG tablet, Take 10 mg by mouth daily., Disp: , Rfl:  .  levonorgestrel (MIRENA) 20 MCG/24HR IUD, 1 each by Intrauterine route once., Disp: , Rfl:  .  metroNIDAZOLE (FLAGYL) 500 MG tablet, Take 1 tablet (500 mg total) by mouth 2 (two) times daily., Disp: 14 tablet, Rfl: 2 .  Multiple Vitamins-Minerals (MULTIVITAMIN GUMMIES ADULT PO), Take by mouth., Disp: , Rfl:  Allergies  Allergen Reactions  . Pineapple Swelling    Lip swelling  . Milk-Related Compounds Hives  . Orange Concentrate [Flavoring Agent] Hives  . Sulfur Swelling  . Tylenol [Acetaminophen] Swelling    Any tylenol based medication makes her swell    Social History   Tobacco Use  . Smoking status: Never Smoker  . Smokeless tobacco: Never Used  Substance Use Topics  . Alcohol use: Yes    Comment: occ    Family History  Problem Relation Age of Onset  . Diabetes Mother   . Heart murmur Mother   . Diabetes type II Father   . Lymphoma Father   . HIV Father   . Anesthesia problems Neg Hx   . Hypotension Neg Hx   .  Malignant hyperthermia Neg Hx   . Pseudochol deficiency Neg Hx        Review of Systems Constitutional: negative for weight loss Genitourinary:negative for abnormal menstrual periods and vaginal discharge   Objective:   BP 115/78   Pulse 93   Wt 151 lb 9.6 oz (68.8 kg)   BMI 30.62 kg/m    General:   alert  Skin:   no rash or abnormalities  Lungs:   clear to auscultation bilaterally  Heart:   regular rate and rhythm, S1, S2 normal, no murmur, click, rub or gallop  Breasts:   normal without suspicious masses, skin or nipple changes or axillary nodes  Abdomen:  normal findings: no organomegaly, soft, non-tender and no hernia  Pelvis:  External genitalia: normal general appearance Urinary system: urethral meatus normal and bladder without fullness, nontender Vaginal: normal without tenderness, induration or masses Cervix: normal appearance Adnexa: normal bimanual exam Uterus: anteverted and non-tender, normal size   Lab Review Urine pregnancy test Labs reviewed yes Radiologic studies reviewed no  50% of 15 min visit spent on counseling and coordination of care.  Assessment:    37 y.o., continuing IUD, no contraindications.   Plan:    All questions answered. Discussed healthy lifestyle modifications. Follow up as needed. No orders of the defined types were placed in this encounter.  No orders of the defined types were placed in this encounter.  Need to obtain previous records Follow up as needed.     Shelly Bombard MD 01-27-2018

## 2018-07-08 ENCOUNTER — Emergency Department (HOSPITAL_COMMUNITY): Payer: BC Managed Care – PPO

## 2018-07-08 ENCOUNTER — Other Ambulatory Visit: Payer: Self-pay

## 2018-07-08 ENCOUNTER — Emergency Department (HOSPITAL_COMMUNITY)
Admission: EM | Admit: 2018-07-08 | Discharge: 2018-07-09 | Disposition: A | Discharge status: Home / Self Care | Payer: BC Managed Care – PPO | Attending: Emergency Medicine | Admitting: Emergency Medicine

## 2018-07-08 ENCOUNTER — Encounter (HOSPITAL_COMMUNITY): Payer: Self-pay

## 2018-07-08 DIAGNOSIS — Z79899 Other long term (current) drug therapy: Secondary | ICD-10-CM | POA: Diagnosis not present

## 2018-07-08 DIAGNOSIS — R0789 Other chest pain: Secondary | ICD-10-CM | POA: Insufficient documentation

## 2018-07-08 DIAGNOSIS — K219 Gastro-esophageal reflux disease without esophagitis: Secondary | ICD-10-CM

## 2018-07-08 LAB — BASIC METABOLIC PANEL
ANION GAP: 6 (ref 5–15)
BUN: 15 mg/dL (ref 6–20)
CALCIUM: 9.9 mg/dL (ref 8.9–10.3)
CO2: 26 mmol/L (ref 22–32)
Chloride: 104 mmol/L (ref 98–111)
Creatinine, Ser: 0.95 mg/dL (ref 0.44–1.00)
GLUCOSE: 98 mg/dL (ref 70–99)
Potassium: 4.6 mmol/L (ref 3.5–5.1)
Sodium: 136 mmol/L (ref 135–145)

## 2018-07-08 LAB — CBC
HCT: 42.7 % (ref 36.0–46.0)
Hemoglobin: 13.7 g/dL (ref 12.0–15.0)
MCH: 28.6 pg (ref 26.0–34.0)
MCHC: 32.1 g/dL (ref 30.0–36.0)
MCV: 89.1 fL (ref 80.0–100.0)
PLATELETS: 338 10*3/uL (ref 150–400)
RBC: 4.79 MIL/uL (ref 3.87–5.11)
RDW: 11.9 % (ref 11.5–15.5)
WBC: 6.8 10*3/uL (ref 4.0–10.5)
nRBC: 0 % (ref 0.0–0.2)

## 2018-07-08 LAB — I-STAT BETA HCG BLOOD, ED (MC, WL, AP ONLY): I-stat hCG, quantitative: <5 m[IU]/mL (ref <5)

## 2018-07-08 LAB — I-STAT TROPONIN, ED: Troponin i, poc: 0 ng/mL (ref 0.00–0.08)

## 2018-07-08 NOTE — ED Triage Notes (Signed)
Pt here with chest pain that began two hours before coming to the ED.  Central chest pain that radiates to the back.  States she tried caffeine for the pain but it made it worse.  A&Ox4, ambulatory to triage.

## 2018-07-09 MED ORDER — ONDANSETRON 4 MG PO TBDP
4.0000 mg | ORAL_TABLET | Freq: Once | ORAL | Status: AC
  Administered 2018-07-09: 4 mg via ORAL
  Filled 2018-07-09: qty 1

## 2018-07-09 MED ORDER — ALUM & MAG HYDROXIDE-SIMETH 200-200-20 MG/5ML PO SUSP
30.0000 mL | Freq: Once | ORAL | Status: AC
  Administered 2018-07-09: 30 mL via ORAL
  Filled 2018-07-09: qty 30

## 2018-07-09 MED ORDER — PANTOPRAZOLE SODIUM 40 MG PO TBEC
40.0000 mg | DELAYED_RELEASE_TABLET | Freq: Once | ORAL | Status: AC
  Administered 2018-07-09: 40 mg via ORAL
  Filled 2018-07-09: qty 1

## 2018-07-09 MED ORDER — LIDOCAINE VISCOUS HCL 2 % MT SOLN
15.0000 mL | Freq: Once | OROMUCOSAL | Status: AC
  Administered 2018-07-09: 15 mL via ORAL
  Filled 2018-07-09: qty 15

## 2018-07-09 MED ORDER — PANTOPRAZOLE SODIUM 40 MG PO TBEC: 40.0000 mg | DELAYED_RELEASE_TABLET | Freq: Every day | ORAL | 0 refills | Status: AC

## 2018-07-09 NOTE — ED Notes (Signed)
Patient verbalizes understanding of discharge instructions. Opportunity for questioning and answers were provided. Armband removed by staff, pt discharged from ED home via POV.  

## 2018-07-09 NOTE — ED Provider Notes (Signed)
TIME SEEN: 1:56 AM  CHIEF COMPLAINT: Chest pain  HPI: Patient is a 37 year old female with no history of hypertension, diabetes, hyperlipidemia or tobacco use who presents to the emergency department with complaints of chest pain.  States she feels like "I am having a baby in my chest".  States she tried Tums, Tylenol without relief.  States that she drank a Coke and it made her pain worse.  No shortness of breath.  Has had some nausea.  No vomiting, dizziness or diaphoresis.  No family history of CAD.  No history of PE, DVT, exogenous estrogen use, recent fractures, surgery, trauma, hospitalization or prolonged travel. No lower extremity swelling or pain. No calf tenderness.  She has an IUD.  ROS: See HPI Constitutional: no fever  Eyes: no drainage  ENT: no runny nose   Cardiovascular:   chest pain  Resp: no SOB  GI: no vomiting GU: no dysuria Integumentary: no rash  Allergy: no hives  Musculoskeletal: no leg swelling  Neurological: no slurred speech ROS otherwise negative  PAST MEDICAL HISTORY/PAST SURGICAL HISTORY:  Past Medical History:  Diagnosis Date  . Hypoglycemia   . No pertinent past medical history     MEDICATIONS:  Prior to Admission medications   Medication Sig Start Date End Date Taking? Authorizing Provider  cetirizine (ZYRTEC) 10 MG tablet Take 10 mg by mouth daily.    [provider]  levonorgestrel (MIRENA) 20 MCG/24HR IUD 1 each by Intrauterine route once.    [provider]  metroNIDAZOLE (FLAGYL) 500 MG tablet Take 1 tablet (500 mg total) by mouth 2 (two) times daily. 12/10/17   Shelly Bombard, MD  Multiple Vitamins-Minerals (MULTIVITAMIN GUMMIES ADULT PO) Take by mouth.    [provider]    ALLERGIES:  Allergies  Allergen Reactions  . Pineapple Swelling    Lip swelling  . Milk-Related Compounds Hives  . Orange Concentrate [Flavoring Agent] Hives  . Sulfur Swelling  . Tylenol [Acetaminophen] Swelling    Any tylenol based  medication makes her swell    SOCIAL HISTORY:  Social History   Tobacco Use  . Smoking status: Never Smoker  . Smokeless tobacco: Never Used  Substance Use Topics  . Alcohol use: Yes    Comment: occ    FAMILY HISTORY: Family History  Problem Relation Age of Onset  . Diabetes Mother   . Heart murmur Mother   . Diabetes type II Father   . Lymphoma Father   . HIV Father   . Anesthesia problems Neg Hx   . Hypotension Neg Hx   . Malignant hyperthermia Neg Hx   . Pseudochol deficiency Neg Hx     EXAM: BP 122/89 (BP Location: Right Arm)   Pulse 64   Temp 98.4 F (36.9 C) (Oral)   Resp 17   SpO2 100%  CONSTITUTIONAL: Alert and oriented and responds appropriately to questions. Well-appearing; well-nourished HEAD: Normocephalic EYES: Conjunctivae clear, pupils appear equal, EOMI ENT: normal nose; moist mucous membranes NECK: Supple, no meningismus, no nuchal rigidity, no LAD  CARD: RRR; S1 and S2 appreciated; no murmurs, no clicks, no rubs, no gallops RESP: Normal chest excursion without splinting or tachypnea; breath sounds clear and equal bilaterally; no wheezes, no rhonchi, no rales, no hypoxia or respiratory distress, speaking full sentences ABD/GI: Normal bowel sounds; non-distended; soft, non-tender, no rebound, no guarding, no peritoneal signs, no hepatosplenomegaly BACK:  The back appears normal and is non-tender to palpation, there is no CVA tenderness EXT: Normal ROM  in all joints; non-tender to palpation; no edema; normal capillary refill; no cyanosis, no calf tenderness or swelling    SKIN: Normal color for age and race; warm; no rash NEURO: Moves all extremities equally PSYCH: The patient's mood and manner are appropriate. Grooming and personal hygiene are appropriate.  MEDICAL DECISION MAKING: Patient here with atypical chest pain.  Seems GI in nature.  Will treat with Maalox, lidocaine, Protonix, Zofran.  Troponin negative.  Doubt PE or dissection.  She is PERC  negative.  Doubt ACS.  I do not feel she needs repeat enzymes.  EKG shows no ischemic abnormality.  Chest x-ray clear.  ED PROGRESS: Patient reports feeling much better after above medications.  She feels comfortable with plan for discharge home.  Recommended over-the-counter medications and will discharge with prescription of Protonix.  Suspect GERD.  At this time, I do not feel there is any life-threatening condition present. I have reviewed and discussed all results (EKG, imaging, lab, urine as appropriate) and exam findings with patient/family. I have reviewed nursing notes and appropriate previous records.  I feel the patient is safe to be discharged home without further emergent workup and can continue workup as an outpatient as needed. Discussed usual and customary return precautions. Patient/family verbalize understanding and are comfortable with this plan.  Outpatient follow-up has been provided if needed. All questions have been answered.     EKG Interpretation  Date/Time:  Thursday July 08 2018 20:46:07 EDT Ventricular Rate:  75 PR Interval:  134 QRS Duration: 84 QT Interval:  366 QTC Calculation: 408 R Axis:   74 Text Interpretation:  Normal sinus rhythm Normal ECG No old tracing to compare Confirmed by Ernie Kasler, Cyril Mourning [phone]) on 07/09/2018 1:51:55 AM          Levie Wages, Delice Bison, DO 07/09/18 0730

## 2018-07-09 NOTE — ED Notes (Signed)
Family at bedside. 

## 2018-07-09 NOTE — ED Notes (Signed)
Pt complains of chest pain that started at 1700 yesterday evening. Pt states she was at work when this all started and it feels like contractions.

## 2019-07-29 ENCOUNTER — Encounter: Payer: Self-pay | Admitting: Obstetrics

## 2019-07-29 ENCOUNTER — Ambulatory Visit (INDEPENDENT_AMBULATORY_CARE_PROVIDER_SITE_OTHER): Payer: BC Managed Care – PPO | Admitting: Obstetrics

## 2019-07-29 ENCOUNTER — Other Ambulatory Visit (HOSPITAL_COMMUNITY)
Admission: RE | Admit: 2019-07-29 | Discharge: 2019-07-29 | Disposition: A | Discharge status: Home / Self Care | Payer: BC Managed Care – PPO | Source: Ambulatory Visit | Attending: Obstetrics | Admitting: Obstetrics

## 2019-07-29 ENCOUNTER — Other Ambulatory Visit: Payer: Self-pay

## 2019-07-29 VITALS — BP 129/82 | HR 77 | Ht 59.0 in | Wt 143.0 lb

## 2019-07-29 DIAGNOSIS — B9689 Other specified bacterial agents as the cause of diseases classified elsewhere: Secondary | ICD-10-CM

## 2019-07-29 DIAGNOSIS — N760 Acute vaginitis: Secondary | ICD-10-CM

## 2019-07-29 DIAGNOSIS — N898 Other specified noninflammatory disorders of vagina: Secondary | ICD-10-CM

## 2019-07-29 DIAGNOSIS — Z113 Encounter for screening for infections with a predominantly sexual mode of transmission: Secondary | ICD-10-CM

## 2019-07-29 DIAGNOSIS — Z01419 Encounter for gynecological examination (general) (routine) without abnormal findings: Secondary | ICD-10-CM | POA: Diagnosis not present

## 2019-07-29 DIAGNOSIS — Z30431 Encounter for routine checking of intrauterine contraceptive device: Secondary | ICD-10-CM

## 2019-07-29 MED ORDER — METRONIDAZOLE 500 MG PO TABS: 500.0000 mg | ORAL_TABLET | Freq: Two times a day (BID) | ORAL | 2 refills | Status: AC

## 2019-07-29 NOTE — Progress Notes (Signed)
Pt is here for annual gyn exam. Last pap 12/09/2017 normal. Liletta inserted 12/16/2017 for contraception.

## 2019-07-29 NOTE — Progress Notes (Signed)
Subjective:        Mackenzie Pierce is a 38 y.o. female here for a routine exam.  Current complaints: Malodorous vaginal discharge.    Personal health questionnaire:  Is patient [name], have a family history of breast and/or ovarian cancer: no Is there a family history of uterine cancer diagnosed at age < 12, gastrointestinal cancer, urinary tract cancer, family member who is a Field seismologist syndrome-associated carrier: no Is the patient overweight and hypertensive, family history of diabetes, personal history of gestational diabetes, preeclampsia or PCOS: no Is patient over 81, have PCOS,  family history of premature CHD under age 37, diabetes, smoke, have hypertension or peripheral artery disease:  yes At any time, has a partner hit, kicked or otherwise hurt or frightened you?: no Over the past 2 weeks, have you felt down, depressed or hopeless?: no Over the past 2 weeks, have you felt little interest or pleasure in doing things?:no   Gynecologic History Patient's last menstrual period was 07/20/2019 (exact date). Contraception: IUD Last Pap: 12-09-2017. Results were: normal Last mammogram: n/a. Results were: n/a  Obstetric History OB History  Gravida Para Term Preterm AB Living  2 2 2  0 0 2  SAB TAB Ectopic Multiple Live Births  0 0 0 0 2    # Outcome Date GA Lbr Len/2nd Weight Sex Delivery Anes PTL Lv  2 Term      CS-LTranv   LIV     Birth Comments: repeat c/section  1 Term      CS-LTranv   LIV     Birth Comments: failure to progress    Past Medical History:  Diagnosis Date  . Hypoglycemia   . No pertinent past medical history     Past Surgical History:  Procedure Laterality Date  . CESAREAN SECTION     x2     Current Outpatient Medications:  .  cetirizine (ZYRTEC) 10 MG tablet, Take 10 mg by mouth daily., Disp: , Rfl:  .  levonorgestrel (MIRENA) 20 MCG/24HR IUD, 1 each by Intrauterine route once., Disp: , Rfl:  .  metroNIDAZOLE (FLAGYL) 500 MG tablet, Take  1 tablet (500 mg total) by mouth 2 (two) times daily., Disp: 14 tablet, Rfl: 2 .  Multiple Vitamins-Minerals (MULTIVITAMIN GUMMIES ADULT PO), Take by mouth., Disp: , Rfl:  .  pantoprazole (PROTONIX) 40 MG tablet, Take 1 tablet (40 mg total) by mouth daily. (Patient not taking: Reported on 07/29/2019), Disp: 30 tablet, Rfl: 0 Allergies  Allergen Reactions  . Pineapple Swelling    Lip swelling  . Milk-Related Compounds Hives  . Orange Concentrate [Flavoring Agent] Hives  . Sulfur Swelling  . Tylenol [Acetaminophen] Swelling    Any tylenol based medication makes her swell    Social History   Tobacco Use  . Smoking status: Never Smoker  . Smokeless tobacco: Never Used  Substance Use Topics  . Alcohol use: Yes    Comment: occ    Family History  Problem Relation Age of Onset  . Diabetes Mother   . Heart murmur Mother   . Diabetes type II Father   . Lymphoma Father   . HIV Father   . Anesthesia problems Neg Hx   . Hypotension Neg Hx   . Malignant hyperthermia Neg Hx   . Pseudochol deficiency Neg Hx       Review of Systems  Constitutional: negative for fatigue and weight loss Respiratory: negative for cough and wheezing Cardiovascular: negative for chest pain, fatigue and  palpitations Gastrointestinal: negative for abdominal pain and change in bowel habits Musculoskeletal:negative for myalgias Neurological: negative for gait problems and tremors Behavioral/Psych: negative for abusive relationship, depression Endocrine: negative for temperature intolerance    Genitourinary:negative for abnormal menstrual periods, genital lesions, hot flashes, sexual problems and vaginal discharge Integument/breast: negative for breast lump, breast tenderness, nipple discharge and skin lesion(s)    Objective:       BP 129/82   Pulse 77   Ht 4\' 11"  (1.499 m)   Wt 143 lb (64.9 kg)   LMP 07/20/2019 (Exact Date)   BMI 28.88 kg/m  General:   alert  Skin:   no rash or abnormalities   Lungs:   clear to auscultation bilaterally  Heart:   regular rate and rhythm, S1, S2 normal, no murmur, click, rub or gallop  Breasts:   normal without suspicious masses, skin or nipple changes or axillary nodes  Abdomen:  normal findings: no organomegaly, soft, non-tender and no hernia  Pelvis:  External genitalia: normal general appearance Urinary system: urethral meatus normal and bladder without fullness, nontender Vaginal: normal without tenderness, induration or masses Cervix: normal appearance Adnexa: normal bimanual exam Uterus: anteverted and non-tender, normal size   Lab Review Urine pregnancy test Labs reviewed yes Radiologic studies reviewed no  50% of 25 min visit spent on counseling and coordination of care.   Assessment:    Healthy female exam.    Plan:    Education reviewed: calcium supplements, depression evaluation, low fat, low cholesterol diet, safe sex/STD prevention, self breast exams and weight bearing exercise. Contraception: IUD. Follow up in: 1 year.   Meds ordered this encounter  Medications  . metroNIDAZOLE (FLAGYL) 500 MG tablet    Sig: Take 1 tablet (500 mg total) by mouth 2 (two) times daily.    Dispense:  14 tablet    Refill:  2   Orders Placed This Encounter  Procedures  . Hepatitis B surface antigen  . Hepatitis C antibody  . HIV antibody  . RPR    Shelly Bombard, MD 07/29/2019 10:34 AM

## 2019-07-30 LAB — HIV ANTIBODY (ROUTINE TESTING W REFLEX): HIV Screen 4th Generation wRfx: NONREACTIVE

## 2019-07-30 LAB — RPR: RPR Ser Ql: NONREACTIVE

## 2019-07-30 LAB — HEPATITIS C ANTIBODY: Hep C Virus Ab: <0.1 s/co ratio (ref 0.0–0.9)

## 2019-07-30 LAB — HEPATITIS B SURFACE ANTIGEN: Hepatitis B Surface Ag: NEGATIVE

## 2019-08-01 LAB — CERVICOVAGINAL ANCILLARY ONLY
Bacterial Vaginitis (gardnerella): POSITIVE — AB
Candida Glabrata: NEGATIVE
Candida Vaginitis: NEGATIVE
Chlamydia: NEGATIVE
Comment: NEGATIVE
Comment: NEGATIVE
Comment: NEGATIVE
Comment: NEGATIVE
Comment: NEGATIVE
Comment: NORMAL
Neisseria Gonorrhea: NEGATIVE
Trichomonas: POSITIVE — AB

## 2019-08-02 ENCOUNTER — Other Ambulatory Visit: Payer: Self-pay | Admitting: Obstetrics

## 2019-08-03 ENCOUNTER — Other Ambulatory Visit: Payer: Self-pay | Admitting: Obstetrics

## 2019-08-03 LAB — CYTOLOGY - PAP
Comment: NEGATIVE
Diagnosis: NEGATIVE
Diagnosis: REACTIVE
High risk HPV: NEGATIVE

## 2021-04-02 ENCOUNTER — Other Ambulatory Visit (HOSPITAL_COMMUNITY)
Admission: RE | Admit: 2021-04-02 | Discharge: 2021-04-02 | Disposition: A | Discharge status: Home / Self Care | Payer: Medicaid Other | Source: Ambulatory Visit | Attending: Obstetrics | Admitting: Obstetrics

## 2021-04-02 ENCOUNTER — Other Ambulatory Visit: Payer: Self-pay

## 2021-04-02 ENCOUNTER — Encounter: Payer: Self-pay | Admitting: Obstetrics

## 2021-04-02 ENCOUNTER — Ambulatory Visit (INDEPENDENT_AMBULATORY_CARE_PROVIDER_SITE_OTHER): Payer: Medicaid Other | Admitting: Obstetrics

## 2021-04-02 VITALS — BP 125/80 | HR 73 | Ht 59.0 in | Wt 148.0 lb

## 2021-04-02 DIAGNOSIS — B9689 Other specified bacterial agents as the cause of diseases classified elsewhere: Secondary | ICD-10-CM | POA: Diagnosis not present

## 2021-04-02 DIAGNOSIS — Z113 Encounter for screening for infections with a predominantly sexual mode of transmission: Secondary | ICD-10-CM | POA: Insufficient documentation

## 2021-04-02 DIAGNOSIS — N898 Other specified noninflammatory disorders of vagina: Secondary | ICD-10-CM | POA: Diagnosis not present

## 2021-04-02 DIAGNOSIS — Z01419 Encounter for gynecological examination (general) (routine) without abnormal findings: Secondary | ICD-10-CM | POA: Diagnosis not present

## 2021-04-02 DIAGNOSIS — N760 Acute vaginitis: Secondary | ICD-10-CM

## 2021-04-02 DIAGNOSIS — Z124 Encounter for screening for malignant neoplasm of cervix: Secondary | ICD-10-CM | POA: Insufficient documentation

## 2021-04-02 MED ORDER — METRONIDAZOLE 500 MG PO TABS: 500.0000 mg | ORAL_TABLET | Freq: Two times a day (BID) | ORAL | 2 refills | Status: DC

## 2021-04-02 NOTE — Progress Notes (Signed)
Subjective:        Mackenzie Pierce is a 40 y.o. female here for a routine exam.  Current complaints: vaginal discharge.    Personal health questionnaire:  Is patient Mackenzie Pierce, have a family history of breast and/or ovarian cancer: no Is there a family history of uterine cancer diagnosed at age < 36, gastrointestinal cancer, urinary tract cancer, family member who is a Field seismologist syndrome-associated carrier: no Is the patient overweight and hypertensive, family history of diabetes, personal history of gestational diabetes, preeclampsia or PCOS: no Is patient over 62, have PCOS,  family history of premature CHD under age 40, diabetes, smoke, have hypertension or peripheral artery disease:  no At any time, has a partner hit, kicked or otherwise hurt or frightened you?: no Over the past 2 weeks, have you felt down, depressed or hopeless?: no Over the past 2 weeks, have you felt little interest or pleasure in doing things?:no   Gynecologic History No LMP recorded. (Menstrual status: IUD). Contraception: IUD Last Pap: 07-29-2019. Results were: normal Last mammogram: n/a. Results were: n/a  Obstetric History OB History  Gravida Para Term Preterm AB Living  2 2 2  0 0 2  SAB IAB Ectopic Multiple Live Births  0 0 0 0 2    # Outcome Date GA Lbr Len/2nd Weight Sex Delivery Anes PTL Lv  2 Term      CS-LTranv   LIV     Birth Comments: repeat c/section  1 Term      CS-LTranv   LIV     Birth Comments: failure to progress    Past Medical History:  Diagnosis Date   Hypoglycemia    No pertinent past medical history     Past Surgical History:  Procedure Laterality Date   CESAREAN SECTION     x2     Current Outpatient Medications:    levonorgestrel (MIRENA) 20 MCG/24HR IUD, 1 each by Intrauterine route once., Disp: , Rfl:    metroNIDAZOLE (FLAGYL) 500 MG tablet, Take 1 tablet (500 mg total) by mouth 2 (two) times daily., Disp: 14 tablet, Rfl: 2   Multiple Vitamins-Minerals  (MULTIVITAMIN GUMMIES ADULT PO), Take by mouth., Disp: , Rfl:    cetirizine (ZYRTEC) 10 MG tablet, Take 10 mg by mouth daily. (Patient not taking: Reported on 04/02/2021), Disp: , Rfl:    metroNIDAZOLE (FLAGYL) 500 MG tablet, Take 1 tablet (500 mg total) by mouth 2 (two) times daily. (Patient not taking: Reported on 04/02/2021), Disp: 14 tablet, Rfl: 2   pantoprazole (PROTONIX) 40 MG tablet, Take 1 tablet (40 mg total) by mouth daily. (Patient not taking: No sig reported), Disp: 30 tablet, Rfl: 0 Allergies  Allergen Reactions   Pineapple Swelling    Lip swelling   Elemental Sulfur Swelling   Milk-Related Compounds Hives   Orange Concentrate [Flavoring Agent] Hives   Tylenol [Acetaminophen] Swelling    Any tylenol based medication makes her swell    Social History   Tobacco Use   Smoking status: Never   Smokeless tobacco: Never  Substance Use Topics   Alcohol use: Yes    Comment: occ    Family History  Problem Relation Age of Onset   Diabetes Mother    Heart murmur Mother    Diabetes type II Father    Lymphoma Father    HIV Father    Anesthesia problems Neg Hx    Hypotension Neg Hx    Malignant hyperthermia Neg Hx    Pseudochol deficiency  Neg Hx       Review of Systems  Constitutional: negative for fatigue and weight loss Respiratory: negative for cough and wheezing Cardiovascular: negative for chest pain, fatigue and palpitations Gastrointestinal: negative for abdominal pain and change in bowel habits Musculoskeletal:negative for myalgias Neurological: negative for gait problems and tremors Behavioral/Psych: negative for abusive relationship, depression Endocrine: negative for temperature intolerance    Genitourinary:negative for abnormal menstrual periods, genital lesions, hot flashes, sexual problems and vaginal discharge Integument/breast: negative for breast lump, breast tenderness, nipple discharge and skin lesion(s)    Objective:       BP 125/80   Pulse 73    Ht 4\' 11"  (1.499 m)   Wt 148 lb (67.1 kg)   BMI 29.89 kg/m  General:   alert  Skin:   no rash or abnormalities  Lungs:   clear to auscultation bilaterally  Heart:   regular rate and rhythm, S1, S2 normal, no murmur, click, rub or gallop  Breasts:   normal without suspicious masses, skin or nipple changes or axillary nodes  Abdomen:  normal findings: no organomegaly, soft, non-tender and no hernia  Pelvis:  External genitalia: normal general appearance Urinary system: urethral meatus normal and bladder without fullness, nontender Vaginal: normal without tenderness, induration or masses Cervix: normal appearance Adnexa: normal bimanual exam Uterus: anteverted and non-tender, normal size   Lab Review Urine pregnancy test Labs reviewed yes Radiologic studies reviewed yes  I have spent a total of 20 minutes of face-to-face time, excluding clinical staff time, reviewing notes and preparing to see patient, ordering tests and/or medications, and counseling the patient.   Assessment:     1. Encounter for routine gynecological examination with Papanicolaou smear of cervix Rx: - Cytology - PAP( [place])  2. Vaginal discharge Rx: - Cervicovaginal ancillary only( [place])  3. Screening for STD (sexually transmitted disease) Rx: - HIV Antibody (routine testing w rflx) - Hepatitis C antibody - Hepatitis B surface antigen - RPR  4. BV (bacterial vaginosis) Rx: - metroNIDAZOLE (FLAGYL) 500 MG tablet; Take 1 tablet (500 mg total) by mouth 2 (two) times daily.  Dispense: 14 tablet; Refill: 2    Plan:    Education reviewed: calcium supplements, depression evaluation, low fat, low cholesterol diet, safe sex/STD prevention, self breast exams, and weight bearing exercise. Contraception: IUD. Follow up in: 1 year.   Meds ordered this encounter  Medications   metroNIDAZOLE (FLAGYL) 500 MG tablet    Sig: Take 1 tablet (500 mg total) by mouth 2 (two) times daily.    Dispense:   14 tablet    Refill:  2   Orders Placed This Encounter  Procedures   HIV Antibody (routine testing w rflx)   Hepatitis C antibody   Hepatitis B surface antigen   RPR    Shelly Bombard, MD 04/02/2021 1:54 PM

## 2021-04-02 NOTE — Progress Notes (Signed)
Pt presents for Annual  STD Screening desires full panel.  LMP 03/22/21  Contraception :IUD Family Hx of Breast Cancer : None

## 2021-04-03 LAB — HEPATITIS B SURFACE ANTIGEN: Hepatitis B Surface Ag: NEGATIVE

## 2021-04-03 LAB — CERVICOVAGINAL ANCILLARY ONLY
Bacterial Vaginitis (gardnerella): POSITIVE — AB
Candida Glabrata: NEGATIVE
Candida Vaginitis: NEGATIVE
Chlamydia: NEGATIVE
Comment: NEGATIVE
Comment: NEGATIVE
Comment: NEGATIVE
Comment: NEGATIVE
Comment: NEGATIVE
Comment: NORMAL
Neisseria Gonorrhea: NEGATIVE
Trichomonas: POSITIVE — AB

## 2021-04-03 LAB — HEPATITIS C ANTIBODY: Hep C Virus Ab: <0.1 s/co ratio (ref 0.0–0.9)

## 2021-04-03 LAB — RPR: RPR Ser Ql: NONREACTIVE

## 2021-04-03 LAB — HIV ANTIBODY (ROUTINE TESTING W REFLEX): HIV Screen 4th Generation wRfx: NONREACTIVE

## 2021-04-04 LAB — CYTOLOGY - PAP
Comment: NEGATIVE
Diagnosis: NEGATIVE
High risk HPV: NEGATIVE

## 2021-04-08 ENCOUNTER — Other Ambulatory Visit: Payer: Self-pay | Admitting: Obstetrics

## 2021-04-08 ENCOUNTER — Telehealth: Payer: Self-pay

## 2021-04-08 NOTE — Telephone Encounter (Signed)
Pt notified of results  Voiced understanding pt had no questions  Made aware partner needs treatment  And she will need TOC Pt voiced understanding  Made aware Rx has been sent.

## 2021-04-08 NOTE — Telephone Encounter (Signed)
-----   Message from Shelly Bombard, MD sent at 04/08/2021  6:19 AM EDT ----- Flagyl Rx for Trichomonas and BV

## 2022-03-10 ENCOUNTER — Telehealth: Payer: Self-pay

## 2022-06-21 ENCOUNTER — Ambulatory Visit: Payer: Medicaid Other

## 2022-06-21 ENCOUNTER — Other Ambulatory Visit: Payer: Self-pay | Admitting: Obstetrics

## 2022-06-21 DIAGNOSIS — Z1231 Encounter for screening mammogram for malignant neoplasm of breast: Secondary | ICD-10-CM

## 2022-07-10 ENCOUNTER — Other Ambulatory Visit (HOSPITAL_COMMUNITY)
Admission: RE | Admit: 2022-07-10 | Discharge: 2022-07-10 | Disposition: A | Discharge status: Home / Self Care | Payer: Medicaid Other | Source: Ambulatory Visit | Attending: Obstetrics | Admitting: Obstetrics

## 2022-07-10 ENCOUNTER — Encounter: Payer: Self-pay | Admitting: Obstetrics

## 2022-07-10 ENCOUNTER — Ambulatory Visit (INDEPENDENT_AMBULATORY_CARE_PROVIDER_SITE_OTHER): Payer: Medicaid Other | Admitting: Obstetrics

## 2022-07-10 VITALS — BP 125/82 | HR 77 | Ht 59.0 in | Wt 138.2 lb

## 2022-07-10 DIAGNOSIS — N898 Other specified noninflammatory disorders of vagina: Secondary | ICD-10-CM

## 2022-07-10 DIAGNOSIS — G5601 Carpal tunnel syndrome, right upper limb: Secondary | ICD-10-CM

## 2022-07-10 DIAGNOSIS — Z1239 Encounter for other screening for malignant neoplasm of breast: Secondary | ICD-10-CM | POA: Diagnosis not present

## 2022-07-10 DIAGNOSIS — Z113 Encounter for screening for infections with a predominantly sexual mode of transmission: Secondary | ICD-10-CM | POA: Diagnosis not present

## 2022-07-10 DIAGNOSIS — Z01419 Encounter for gynecological examination (general) (routine) without abnormal findings: Secondary | ICD-10-CM | POA: Insufficient documentation

## 2022-07-10 NOTE — Progress Notes (Signed)
Subjective:        Mackenzie Pierce is a 41 y.o. female here for a routine exam.  Current complaints: Vaginal discharge.    Personal health questionnaire:  Is patient Mackenzie Pierce, have a family history of breast and/or ovarian cancer: no Is there a family history of uterine cancer diagnosed at age < 34, gastrointestinal cancer, urinary tract cancer, family member who is a Field seismologist syndrome-associated carrier: no Is the patient overweight and hypertensive, family history of diabetes, personal history of gestational diabetes, preeclampsia or PCOS: no Is patient over 17, have PCOS,  family history of premature CHD under age 1, diabetes, smoke, have hypertension or peripheral artery disease:  no At any time, has a partner hit, kicked or otherwise hurt or frightened you?: no Over the past 2 weeks, have you felt down, depressed or hopeless?: no Over the past 2 weeks, have you felt little interest or pleasure in doing things?:no   Gynecologic History Patient's last menstrual period was 07/07/2022. Contraception: IUD Last Pap: 2022. Results were: normal Last mammogram: none. Results were: n/a  Obstetric History OB History  Gravida Para Term Preterm AB Living  '2 2 2 '$ 0 0 2  SAB IAB Ectopic Multiple Live Births  0 0 0 0 2    # Outcome Date GA Lbr Len/2nd Weight Sex Delivery Anes PTL Lv  2 Term      CS-LTranv   LIV     Birth Comments: repeat c/section  1 Term      CS-LTranv   LIV     Birth Comments: failure to progress    Past Medical History:  Diagnosis Date   Hypoglycemia    No pertinent past medical history     Past Surgical History:  Procedure Laterality Date   CESAREAN SECTION     x2     Current Outpatient Medications:    levonorgestrel (MIRENA) 20 MCG/24HR IUD, 1 each by Intrauterine route once., Disp: , Rfl:    cetirizine (ZYRTEC) 10 MG tablet, Take 10 mg by mouth daily. (Patient not taking: Reported on 04/02/2021), Disp: , Rfl:    metroNIDAZOLE (FLAGYL) 500 MG  tablet, Take 1 tablet (500 mg total) by mouth 2 (two) times daily. (Patient not taking: Reported on 04/02/2021), Disp: 14 tablet, Rfl: 2   metroNIDAZOLE (FLAGYL) 500 MG tablet, Take 1 tablet (500 mg total) by mouth 2 (two) times daily. (Patient not taking: Reported on 07/10/2022), Disp: 14 tablet, Rfl: 2   Multiple Vitamins-Minerals (MULTIVITAMIN GUMMIES ADULT PO), Take by mouth. (Patient not taking: Reported on 07/10/2022), Disp: , Rfl:    pantoprazole (PROTONIX) 40 MG tablet, Take 1 tablet (40 mg total) by mouth daily. (Patient not taking: Reported on 07/29/2019), Disp: 30 tablet, Rfl: 0 Allergies  Allergen Reactions   Pineapple Swelling    Lip swelling   Elemental Sulfur Swelling   Milk-Related Compounds Hives   Orange Concentrate [Flavoring Agent] Hives   Tylenol [Acetaminophen] Swelling    Any tylenol based medication makes her swell    Social History   Tobacco Use   Smoking status: Never    Passive exposure: Never   Smokeless tobacco: Never  Substance Use Topics   Alcohol use: Yes    Comment: occ    Family History  Problem Relation Age of Onset   Diabetes Mother    Heart murmur Mother    Diabetes type II Father    Lymphoma Father    HIV Father    Anesthesia problems Neg Hx  Hypotension Neg Hx    Malignant hyperthermia Neg Hx    Pseudochol deficiency Neg Hx       Review of Systems  Constitutional: negative for fatigue and weight loss Respiratory: negative for cough and wheezing Cardiovascular: negative for chest pain, fatigue and palpitations Gastrointestinal: negative for abdominal pain and change in bowel habits Musculoskeletal:positive for carpal tunnel of right hand Neurological: negative for gait problems and tremors Behavioral/Psych: negative for abusive relationship, depression Endocrine: negative for temperature intolerance    Genitourinary:positive for vaginal discharge.  negative for abnormal menstrual periods, genital lesions, hot flashes, sexual  problems  Integument/breast: negative for breast lump, breast tenderness, nipple discharge and skin lesion(s)    Objective:       BP 125/82   Pulse 77   Ht '4\' 11"'$  (1.499 m)   Wt 138 lb 3.2 oz (62.7 kg)   LMP 07/07/2022   BMI 27.91 kg/m  General:   Alert and no distress  Skin:   no rash or abnormalities  Lungs:   clear to auscultation bilaterally  Heart:   regular rate and rhythm, S1, S2 normal, no murmur, click, rub or gallop  Breasts:   normal without suspicious masses, skin or nipple changes or axillary nodes  Abdomen:  normal findings: no organomegaly, soft, non-tender and no hernia  Pelvis:  External genitalia: normal general appearance Urinary system: urethral meatus normal and bladder without fullness, nontender Vaginal: normal without tenderness, induration or masses Cervix: normal appearance Adnexa: normal bimanual exam Uterus: anteverted and non-tender, normal size   Lab Review Urine pregnancy test Labs reviewed yes Radiologic studies reviewed: no  I have spent a total of 20 minutes of face-to-face`time, excluding clinical staff time, reviewing notes and preparing to see patient, ordering tests and/or medications, and counseling the patient.   Assessment:    1. Encounter for routine gynecological examination with Papanicolaou smear of cervix Rx: - Cytology - PAP( [place])  2. Vaginal discharge Rx: - Cervicovaginal ancillary only( [place])  3. Screening for STD (sexually transmitted disease) Rx: - Hepatitis B surface antigen - Hepatitis C antibody - RPR - HIV Antibody (routine testing w rflx)  4. Screening breast examination Rx: - MM 3D SCREEN BREAST BILATERAL; Future  5. Carpal tunnel syndrome on right Rx: - Ambulatory referral to Orthopedics     Plan:    Education reviewed: calcium supplements, depression evaluation, low fat, low cholesterol diet, safe sex/STD prevention, self breast exams, and weight bearing exercise. Contraception:  IUD. Mammogram ordered. Follow up in: 1 year.    Orders Placed This Encounter  Procedures   MM 3D SCREEN BREAST BILATERAL    Standing Status:   Future    Standing Expiration Date:   07/11/2023    Order Specific Question:   Reason for Exam (SYMPTOM  OR DIAGNOSIS REQUIRED)    Answer:   Needs routine screening    Order Specific Question:   Is the patient pregnant?    Answer:   No    Order Specific Question:   Preferred imaging location?    Answer:   [facility]   Hepatitis B surface antigen   Hepatitis C antibody   RPR   HIV Antibody (routine testing w rflx)   Ambulatory referral to Orthopedics    Referral Priority:   Routine    Referral Type:   Consultation    Number of Visits Requested:   1     Shelly Bombard, MD 07/10/2022 2:11 PM

## 2022-07-10 NOTE — Progress Notes (Signed)
Pt presents for annual and all STD testing.

## 2022-07-11 LAB — CERVICOVAGINAL ANCILLARY ONLY
Bacterial Vaginitis (gardnerella): POSITIVE — AB
Candida Glabrata: NEGATIVE
Candida Vaginitis: NEGATIVE
Chlamydia: NEGATIVE
Comment: NEGATIVE
Comment: NEGATIVE
Comment: NEGATIVE
Comment: NEGATIVE
Comment: NEGATIVE
Comment: NORMAL
Neisseria Gonorrhea: NEGATIVE
Trichomonas: NEGATIVE

## 2022-07-11 LAB — HEPATITIS B SURFACE ANTIGEN: Hepatitis B Surface Ag: NEGATIVE

## 2022-07-11 LAB — HEPATITIS C ANTIBODY: Hep C Virus Ab: NONREACTIVE

## 2022-07-11 LAB — RPR: RPR Ser Ql: NONREACTIVE

## 2022-07-11 LAB — HIV ANTIBODY (ROUTINE TESTING W REFLEX): HIV Screen 4th Generation wRfx: NONREACTIVE

## 2022-07-15 ENCOUNTER — Other Ambulatory Visit: Payer: Self-pay | Admitting: Obstetrics

## 2022-07-15 DIAGNOSIS — N760 Acute vaginitis: Secondary | ICD-10-CM

## 2022-07-15 LAB — CYTOLOGY - PAP
Comment: NEGATIVE
Diagnosis: NEGATIVE
High risk HPV: NEGATIVE

## 2022-07-15 MED ORDER — METRONIDAZOLE 500 MG PO TABS: 500.0000 mg | ORAL_TABLET | Freq: Two times a day (BID) | ORAL | 2 refills | Status: AC

## 2022-07-15 NOTE — Progress Notes (Signed)
TC to pt to notify of BV and RX for Flagyl. Pt verbalized understanding. Pharmacy location confirmed with pt.

## 2022-07-22 NOTE — Telephone Encounter (Signed)
PT WANTS TO ONLY SCHEDULE WITH DR [facility] FOR ANNUAL EXAMS. WILL CALL BACK IN AUG

## 2022-07-30 ENCOUNTER — Ambulatory Visit (HOSPITAL_BASED_OUTPATIENT_CLINIC_OR_DEPARTMENT_OTHER)
Admission: RE | Admit: 2022-07-30 | Discharge: 2022-07-30 | Disposition: A | Discharge status: Home / Self Care | Payer: Medicaid Other | Source: Ambulatory Visit | Attending: Obstetrics | Admitting: Obstetrics

## 2022-07-30 DIAGNOSIS — Z1231 Encounter for screening mammogram for malignant neoplasm of breast: Secondary | ICD-10-CM | POA: Diagnosis not present

## 2022-07-30 DIAGNOSIS — Z1239 Encounter for other screening for malignant neoplasm of breast: Secondary | ICD-10-CM | POA: Diagnosis present

## 2022-08-01 ENCOUNTER — Ambulatory Visit: Payer: Medicaid Other | Attending: Obstetrics | Admitting: Occupational Therapy

## 2022-08-01 DIAGNOSIS — R208 Other disturbances of skin sensation: Secondary | ICD-10-CM | POA: Diagnosis not present

## 2022-08-01 DIAGNOSIS — M25531 Pain in right wrist: Secondary | ICD-10-CM | POA: Diagnosis not present

## 2022-08-01 DIAGNOSIS — M6281 Muscle weakness (generalized): Secondary | ICD-10-CM | POA: Insufficient documentation

## 2022-08-01 NOTE — Therapy (Signed)
OUTPATIENT OCCUPATIONAL THERAPY ORTHO EVALUATION  Patient Name: Mackenzie Pierce MRN: [id number] DOB:1980-11-07, 41 y.o., female Today's Date: 08/01/2022  PCP: Shelly Bombard, MD REFERRING PROVIDER: Shelly Bombard, MD  END OF SESSION:  OT End of Session - 08/01/22 1315      Visit Number 1     Number of Visits 5    Date for OT Re-Evaluation 10/03/22    Authorization Type [place] [employer] and [employer] - requires auth    OT Start Time 1320    OT Stop Time 1402     OT Time Calculation (min) 42 min     Activity Tolerance Patient tolerated treatment well     Behavior During Therapy WFL for tasks assessed/performed     Past Medical History:  Diagnosis Date   Hypoglycemia    No pertinent past medical history    Past Surgical History:  Procedure Laterality Date   CESAREAN SECTION     x2   Patient Active Problem List   Diagnosis Date Noted   Vaginitis and vulvovaginitis, unspecified 08/01/2013   Urinary tract infection, site not specified 12/13/2012    ONSET DATE: 07/04/2022  REFERRING DIAG: G56.01 (ICD-10-CM) - Carpal tunnel syndrome on right  THERAPY DIAG:  No diagnosis found.  Rationale for Evaluation and Treatment: Rehabilitation  SUBJECTIVE:   SUBJECTIVE STATEMENT: Pt reports pain in RUE that goes from her upper arm through her hand. It is to the point that it wakes her up from sleep and causes her to be in tears. She has extreme numbness, especially in middle finger. She does not have a wrist brace. She states she has not been contacted by orthopedics and agrees to reach out to referring provider.    Pt accompanied by: self  PERTINENT HISTORY: Pt referred by OBGYN given R carpal tunnel symptoms. Pt reports these symptoms had a sudden onset 1 month ago.   PRECAUTIONS: None  WEIGHT BEARING RESTRICTIONS: No  PAIN:  Are you having pain? Yes: NPRS scale: 9/10 Pain location: R shoulder to fingers Pain description: radiant pain; bee stinging Aggravating  factors: positioning Relieving factors: heat Average 9/10 pain  FALLS: Has patient fallen in last 6 months? No   PLOF: Independent; works full time as Lawyer   PATIENT GOALS: Be able to use her hand normally to do her job, which requires a lot of typing, lifting items   OBJECTIVE:   HAND DOMINANCE: Right  FUNCTIONAL OUTCOME MEASURES: Quick Dash: 59.09  UPPER EXTREMITY ROM:     BUE WNL though pain with testing  HAND FUNCTION: Grip strength: Right: 45.1 lbs; Left: 67 lbs  COORDINATION: 9 Hole Peg test: Right: 19 sec  SENSATION: Light touch: Impaired through median distribution of RUE  EDEMA: mild through RUE especially middle finger  COGNITION: Overall cognitive status: Within functional limits for tasks assessed  OBSERVATIONS: Pt ambulates from lobby to therapy gym without AD. No LOB. Pt appears well-kept.    TODAY'S TREATMENT:  Pt provided R prefabricated D-ring wrist brace. Positioning recommendations made with handout provided as noted in pt instructions.   PATIENT EDUCATION: Education details: brace wear, general carpal tunnel recommendations Person educated: Patient Education method: Explanation, Demonstration, and Handouts Education comprehension: verbalized understanding, returned demonstration, and needs further education  HOME EXERCISE PROGRAM: N/A  GOALS:   SHORT TERM GOALS: Target date: 08/29/2022  Pt will return demonstration of RUE HEP.   Baseline: not yet initiated Goal status: INITIAL  2.  Pt will be independent with R wrist brace wear for improved pain management.   Baseline: Initiated this date Goal status: INITIAL   LONG TERM GOALS: Target date: 10/03/2022   Pt will be independent with RUE HEP.   Baseline: not yet initiated Goal status: INITIAL  2.  - Patient will demonstrate at  least 16% improvement with quick Dash score (reporting 43.09% disability or less) indicating improved functional use of affected extremity.  Baseline: 59.09% disability Goal status: INITIAL  3.  Patient will demonstrate at least 55 lbs R grip strength as needed to open jars and other containers.  Baseline: 45.1 lbs Goal status: INITIAL  ASSESSMENT:  CLINICAL IMPRESSION: Patient is a 41 y.o. female who was seen today for occupational therapy evaluation for R carpal tunnel syndrome. Medical history significant for past c-sections. Patient currently presents below baseline level of functioning demonstrating functional deficits and impairments as noted below. Pt would benefit from skilled OT services in the outpatient setting for conservative management of CTS.    PERFORMANCE DEFICITS: in functional skills including ADLs, IADLs, coordination, sensation, strength, pain, body mechanics, and UE functional use.   IMPAIRMENTS: are limiting patient from ADLs, IADLs, rest and sleep, and work.   COMORBIDITIES: has no other co-morbidities that affects occupational performance. Patient will benefit from skilled OT to address above impairments and improve overall function.  MODIFICATION OR ASSISTANCE TO COMPLETE EVALUATION: No modification of tasks or assist necessary to complete an evaluation.  OT OCCUPATIONAL PROFILE AND HISTORY: Problem focused assessment: Including review of records relating to presenting problem.  CLINICAL DECISION MAKING: LOW - limited treatment options, no task modification necessary  REHAB POTENTIAL: Fair for conservative management  EVALUATION COMPLEXITY: Low      PLAN:  OT FREQUENCY: 1x/week  OT DURATION: 5 weeks  PLANNED INTERVENTIONS: self care/ADL training, therapeutic exercise, therapeutic activity, manual therapy, passive range of motion, splinting, electrical stimulation, ultrasound, paraffin, fluidotherapy, cryotherapy, contrast bath, patient/family  education, DME and/or AE instructions, and Re-evaluation  RECOMMENDED OTHER SERVICES: Orthopedics - unsure if referral has been placed  CONSULTED AND AGREED WITH PLAN OF CARE: Patient  PLAN FOR NEXT SESSION: sleep positioning, tendon glides, review handout   Dennis Bast, OT 08/01/2022, 12:45 PM

## 2022-08-02 ENCOUNTER — Ambulatory Visit (HOSPITAL_BASED_OUTPATIENT_CLINIC_OR_DEPARTMENT_OTHER): Payer: Medicaid Other | Admitting: Radiology

## 2022-08-03 ENCOUNTER — Encounter: Payer: Self-pay | Admitting: Occupational Therapy

## 2022-09-05 ENCOUNTER — Encounter: Payer: Self-pay | Admitting: Occupational Therapy

## 2022-09-05 ENCOUNTER — Ambulatory Visit: Payer: Medicaid Other | Attending: Obstetrics | Admitting: Occupational Therapy

## 2022-09-05 DIAGNOSIS — M25531 Pain in right wrist: Secondary | ICD-10-CM | POA: Diagnosis present

## 2022-09-05 DIAGNOSIS — R208 Other disturbances of skin sensation: Secondary | ICD-10-CM | POA: Diagnosis present

## 2022-09-05 DIAGNOSIS — M6281 Muscle weakness (generalized): Secondary | ICD-10-CM | POA: Insufficient documentation

## 2022-09-05 NOTE — Therapy (Signed)
OUTPATIENT OCCUPATIONAL THERAPY ORTHO TREATMENT  Patient Name: Mackenzie Pierce MRN: [id number] DOB:1981-07-04, 41 y.o., female Today's Date: 09/05/2022  PCP: Shelly Bombard, MD REFERRING PROVIDER: Shelly Bombard, MD  END OF SESSION:  OT End of Session - 09/05/22 1236     Visit Number 2    Number of Visits 5    Date for OT Re-Evaluation 10/03/22    Authorization Type [employer] with [place] Medicaid - requires auth (requesting 5 visits)    OT Start Time 1235    OT Stop Time 1315    OT Time Calculation (min) 40 min    Activity Tolerance Patient tolerated treatment well    Behavior During Therapy WFL for tasks assessed/performed            Past Medical History:  Diagnosis Date   Hypoglycemia    No pertinent past medical history    Past Surgical History:  Procedure Laterality Date   CESAREAN SECTION     x2   Patient Active Problem List   Diagnosis Date Noted   Vaginitis and vulvovaginitis, unspecified 08/01/2013   Urinary tract infection, site not specified 12/13/2012    ONSET DATE: 07/04/2022  REFERRING DIAG: G56.01 (ICD-10-CM) - Carpal tunnel syndrome on right  THERAPY DIAG:  Pain in right wrist  Muscle weakness (generalized)  Other disturbances of skin sensation  Rationale for Evaluation and Treatment: Rehabilitation  SUBJECTIVE:   SUBJECTIVE STATEMENT: She got a heating pad and has been using it on her R shoulder and hand without much relief. It does allow her to sleep better. She has changed her sleep positioning without relief. She still wakes up with numbness in her 4th and 3rd digits. She states she knows she has something in the hand but cannot feel it. Fingers not as stiff after ultrasound.   Pt accompanied by: self  PERTINENT HISTORY: Pt referred by OBGYN given R carpal tunnel symptoms. Pt reports these symptoms had a sudden onset 1 month ago.   PRECAUTIONS: None  WEIGHT BEARING RESTRICTIONS: No  PAIN:  Are you having pain? Yes: NPRS  scale: 7/10 Pain location: R shoulder to fingers Pain description: radiant pain; bee stinging Aggravating factors: positioning Relieving factors: heat Average 9/10 pain  FALLS: Has patient fallen in last 6 months? No   PLOF: Independent; works full time as Lawyer   PATIENT GOALS: Be able to use her hand normally to do her job, which requires a lot of typing, lifting items   OBJECTIVE: (from evaluation unless otherwise noted)  HAND DOMINANCE: Right  FUNCTIONAL OUTCOME MEASURES: Quick Dash: 59.09  UPPER EXTREMITY ROM:     BUE WNL though pain with testing  HAND FUNCTION: Grip strength: Right: 45.1 lbs; Left: 67 lbs  COORDINATION: 9 Hole Peg test: Right: 19 sec  SENSATION: Light touch: Impaired through median distribution of RUE  EDEMA: mild through RUE especially middle finger  COGNITION: Overall cognitive status: Within functional limits for tasks assessed  OBSERVATIONS: Pt ambulates from lobby to therapy gym without AD. No LOB. Pt appears well-kept.    TODAY'S TREATMENT:                                                                                                                              -  Therapeutic exercises completed for duration as noted below including:   Reviewed handout from evaluation.   Initiated RUE tendon and median nerve glides as noted in patient instructions to encourage improved pain and sensation.  - Ultrasound completed for duration as noted below including:  Ultrasound applied to R hand  and wrist for 8 minutes, frequency of 3 MHz, 20% duty cycle, and 1.1 W/cm with pt's arm placed on soft towel for promotion of ROM, edema reduction, and pain reduction in affected extremity.  PATIENT EDUCATION: Education details: brace wear, general carpal tunnel recommendations Person educated: Patient Education method: Explanation, Demonstration, and Handouts Education comprehension: verbalized understanding, returned  demonstration, and needs further education  HOME EXERCISE PROGRAM: N/A  GOALS:   SHORT TERM GOALS: Target date: 08/29/2022  Pt will return demonstration of RUE HEP.   Baseline: not yet initiated Goal status: INITIAL  2.  Pt will be independent with R wrist brace wear for improved pain management.   Baseline: Initiated this date Goal status: INITIAL   LONG TERM GOALS: Target date: 10/03/2022   Pt will be independent with RUE HEP.   Baseline: not yet initiated Goal status: INITIAL  2.  - Patient will demonstrate at least 16% improvement with quick Dash score (reporting 43.09% disability or less) indicating improved functional use of affected extremity.  Baseline: 59.09% disability Goal status: INITIAL  3.  Patient will demonstrate at least 55 lbs R grip strength as needed to open jars and other containers.  Baseline: 45.1 lbs Goal status: INITIAL  ASSESSMENT:  CLINICAL IMPRESSION: Patient is a 41 y.o. female who was seen today for occupational therapy evaluation for R carpal tunnel syndrome. Medical history significant for past c-sections. Patient currently presents below baseline level of functioning demonstrating functional deficits and impairments as noted below. Pt would benefit from skilled OT services in the outpatient setting for conservative management of CTS.    PERFORMANCE DEFICITS: in functional skills including ADLs, IADLs, coordination, sensation, strength, pain, body mechanics, and UE functional use.   IMPAIRMENTS: are limiting patient from ADLs, IADLs, rest and sleep, and work.   COMORBIDITIES: has no other co-morbidities that affects occupational performance. Patient will benefit from skilled OT to address above impairments and improve overall function.  MODIFICATION OR ASSISTANCE TO COMPLETE EVALUATION: No modification of tasks or assist necessary to complete an evaluation.  OT OCCUPATIONAL PROFILE AND HISTORY: Problem focused assessment: Including  review of records relating to presenting problem.  CLINICAL DECISION MAKING: LOW - limited treatment options, no task modification necessary  REHAB POTENTIAL: Fair for conservative management   PLAN:  OT FREQUENCY: 1x/week  OT DURATION: 5 weeks  PLANNED INTERVENTIONS: self care/ADL training, therapeutic exercise, therapeutic activity, manual therapy, passive range of motion, splinting, electrical stimulation, ultrasound, paraffin, fluidotherapy, cryotherapy, contrast bath, patient/family education, DME and/or AE instructions, and Re-evaluation  RECOMMENDED OTHER SERVICES: Orthopedics - unsure if referral has been placed  CONSULTED AND AGREED WITH PLAN OF CARE: Patient  PLAN FOR NEXT SESSION: review median nerve and tendon glides, [place], fluido, ergonomics   Darol Destine [place], OT 09/05/2022, 1:42 PM

## 2022-09-12 ENCOUNTER — Ambulatory Visit: Payer: Medicaid Other | Admitting: Occupational Therapy

## 2022-09-12 ENCOUNTER — Encounter: Payer: Self-pay | Admitting: Occupational Therapy

## 2022-09-12 DIAGNOSIS — M6281 Muscle weakness (generalized): Secondary | ICD-10-CM

## 2022-09-12 DIAGNOSIS — R208 Other disturbances of skin sensation: Secondary | ICD-10-CM

## 2022-09-12 DIAGNOSIS — M25531 Pain in right wrist: Secondary | ICD-10-CM | POA: Diagnosis not present

## 2022-09-12 NOTE — Therapy (Signed)
OUTPATIENT OCCUPATIONAL THERAPY ORTHO TREATMENT  Patient Name: Mackenzie Pierce MRN: [id number] DOB:June 17, 1981, 41 y.o., female Today's Date: 09/12/2022  PCP: Shelly Bombard, MD REFERRING PROVIDER: Shelly Bombard, MD  END OF SESSION:  OT End of Session - 09/12/22 1112     Visit Number 3    Number of Visits 5    Date for OT Re-Evaluation 10/03/22    Authorization Type [employer] with  Medicaid - requires auth (requesting 5 visits)    OT Start Time 1111    OT Stop Time 1145    OT Time Calculation (min) 34 min    Activity Tolerance Patient tolerated treatment well    Behavior During Therapy WFL for tasks assessed/performed             Past Medical History:  Diagnosis Date   Hypoglycemia    No pertinent past medical history    Past Surgical History:  Procedure Laterality Date   CESAREAN SECTION     x2   Patient Active Problem List   Diagnosis Date Noted   Vaginitis and vulvovaginitis, unspecified 08/01/2013   Urinary tract infection, site not specified 12/13/2012    ONSET DATE: 07/04/2022  REFERRING DIAG: G56.01 (ICD-10-CM) - Carpal tunnel syndrome on right  THERAPY DIAG:  Pain in right wrist  Muscle weakness (generalized)  Other disturbances of skin sensation  Rationale for Evaluation and Treatment: Rehabilitation  SUBJECTIVE:   SUBJECTIVE STATEMENT: She hasn't had as much numbness in thumb region or R 4th finger.   Pt accompanied by: self  PERTINENT HISTORY: Pt referred by OBGYN given R carpal tunnel symptoms. Pt reports these symptoms had a sudden onset 1 month ago.   PRECAUTIONS: None  WEIGHT BEARING RESTRICTIONS: No  PAIN:  Are you having pain? Yes: NPRS scale: 7/10 Pain location: R shoulder to fingers Pain description: radiant pain; bee stinging Aggravating factors: positioning Relieving factors: heat Average 9/10 pain  FALLS: Has patient fallen in last 6 months? No   PLOF: Independent; works full time as Theatre manager   PATIENT GOALS: Be able to use her hand normally to do her job, which requires a lot of typing, lifting items   OBJECTIVE: (from evaluation unless otherwise noted)  HAND DOMINANCE: Right  FUNCTIONAL OUTCOME MEASURES: Quick Dash: 59.09  UPPER EXTREMITY ROM:     BUE WNL though pain with testing  HAND FUNCTION: Grip strength: Right: 45.1 lbs; Left: 67 lbs  COORDINATION: 9 Hole Peg test: Right: 19 sec  SENSATION: Light touch: Impaired through median distribution of RUE  EDEMA: mild through RUE especially middle finger  COGNITION: Overall cognitive status: Within functional limits for tasks assessed  OBSERVATIONS: Pt ambulates from lobby to therapy gym without AD. No LOB. Pt appears well-kept.    TODAY'S TREATMENT:                                                                                                                              -  Therapeutic exercises completed for duration as noted below including:  Reviewed RUE tendon and median nerve glides as noted in patient instructions to encourage improved pain and sensation.  Pt placed BUE in Fluidotherapy machine with supervised ROM x 10 min. Pt was educated to complete R PROM during modality time to improve ROM and decrease pain/stiffness of affected extremity by use of the machine's massaging action and thermal properties.   - Ultrasound completed for duration as noted below including:  Ultrasound applied to R hand  and wrist for 8 minutes, frequency of 3 MHz, 20% duty cycle, and 1.1 W/cm with pt's arm placed on soft towel for promotion of ROM, edema reduction, and pain reduction in affected extremity. - Self-care/home management completed for duration as noted below including: Kinesio tape to R dorsal wrist using space correcting technique with 30-40% stretch. Pt verbalized understanding of tape and wearing schedule with application on affected extremity.   PATIENT EDUCATION: Education details: Horticulturist, commercial Person educated: Patient Education method: Education officer, environmental, and Handouts Education comprehension: verbalized understanding, returned demonstration, and needs further education  HOME EXERCISE PROGRAM: N/A  GOALS:   SHORT TERM GOALS: Target date: 08/29/2022  Pt will return demonstration of RUE HEP.   Baseline: not yet initiated Goal status: INITIAL  2.  Pt will be independent with R wrist brace wear for improved pain management.   Baseline: Initiated this date Goal status: INITIAL   LONG TERM GOALS: Target date: 10/03/2022   Pt will be independent with RUE HEP.   Baseline: not yet initiated Goal status: INITIAL  2.  - Patient will demonstrate at least 16% improvement with quick Dash score (reporting 43.09% disability or less) indicating improved functional use of affected extremity.  Baseline: 59.09% disability Goal status: INITIAL  3.  Patient will demonstrate at least 55 lbs R grip strength as needed to open jars and other containers.  Baseline: 45.1 lbs Goal status: INITIAL  ASSESSMENT:  CLINICAL IMPRESSION: Pt to benefit from additional skilled OT services in the outpatient setting for conservative management of CTS.    PERFORMANCE DEFICITS: in functional skills including ADLs, IADLs, coordination, sensation, strength, pain, body mechanics, and UE functional use.   IMPAIRMENTS: are limiting patient from ADLs, IADLs, rest and sleep, and work.   COMORBIDITIES: has no other co-morbidities that affects occupational performance. Patient will benefit from skilled OT to address above impairments and improve overall function.  MODIFICATION OR ASSISTANCE TO COMPLETE EVALUATION: No modification of tasks or assist necessary to complete an evaluation.  OT OCCUPATIONAL PROFILE AND HISTORY: Problem focused assessment: Including review of records relating to presenting problem.  CLINICAL DECISION MAKING: LOW - limited treatment options, no task modification  necessary  REHAB POTENTIAL: Fair for conservative management   PLAN:  OT FREQUENCY: 1x/week  OT DURATION: 5 weeks  PLANNED INTERVENTIONS: self care/ADL training, therapeutic exercise, therapeutic activity, manual therapy, passive range of motion, splinting, electrical stimulation, ultrasound, paraffin, fluidotherapy, cryotherapy, contrast bath, patient/family education, DME and/or AE instructions, and Re-evaluation  RECOMMENDED OTHER SERVICES: Orthopedics - unsure if referral has been placed  CONSULTED AND AGREED WITH PLAN OF CARE: Patient  PLAN FOR NEXT SESSION: Tape helpful? [place], fluido, ergonomics   Luis Lopez, OT 09/12/2022, 2:06 PM

## 2022-09-19 ENCOUNTER — Encounter: Payer: Self-pay | Admitting: Occupational Therapy

## 2022-09-19 ENCOUNTER — Ambulatory Visit: Payer: Medicaid Other | Attending: Obstetrics | Admitting: Occupational Therapy

## 2022-09-19 DIAGNOSIS — M25531 Pain in right wrist: Secondary | ICD-10-CM | POA: Insufficient documentation

## 2022-09-19 DIAGNOSIS — M6281 Muscle weakness (generalized): Secondary | ICD-10-CM | POA: Diagnosis present

## 2022-09-19 DIAGNOSIS — R208 Other disturbances of skin sensation: Secondary | ICD-10-CM | POA: Diagnosis present

## 2022-09-19 NOTE — Therapy (Signed)
OUTPATIENT OCCUPATIONAL THERAPY ORTHO TREATMENT  Patient Name: Mackenzie Pierce MRN: [id number] DOB:12-30-1980, 42 y.o., female Today's Date: 09/19/2022  PCP: Shelly Bombard, MD REFERRING PROVIDER: Shelly Bombard, MD  END OF SESSION:  OT End of Session - 09/19/22 1241     Visit Number 4    Number of Visits 5    Date for OT Re-Evaluation 10/03/22    Authorization Type [employer] with [place] [employer] - requires auth (requesting 5 visits)    OT Start Time 1241    OT Stop Time 1315    OT Time Calculation (min) 34 min    Activity Tolerance Patient tolerated treatment well    Behavior During Therapy WFL for tasks assessed/performed             Past Medical History:  Diagnosis Date   Hypoglycemia    No pertinent past medical history    Past Surgical History:  Procedure Laterality Date   CESAREAN SECTION     x2   Patient Active Problem List   Diagnosis Date Noted   Vaginitis and vulvovaginitis, unspecified 08/01/2013   Urinary tract infection, site not specified 12/13/2012    ONSET DATE: 07/04/2022  REFERRING DIAG: G56.01 (ICD-10-CM) - Carpal tunnel syndrome on right  THERAPY DIAG:  Pain in right wrist  Muscle weakness (generalized)  Other disturbances of skin sensation  Rationale for Evaluation and Treatment: Rehabilitation  SUBJECTIVE:   SUBJECTIVE STATEMENT: She did not feel benefit from taping. Difficulty with scanning books and writing. She understands keeping her wrist in neutral with task completion is desired, but there is no way she can accommodate this at work. Still has not heard from PCP about ortho referral.   Pt accompanied by: self  PERTINENT HISTORY: Pt referred by OBGYN given R carpal tunnel symptoms. Pt reports these symptoms had a sudden onset 1 month ago.   PRECAUTIONS: None  WEIGHT BEARING RESTRICTIONS: No  PAIN:  Are you having pain? Yes: NPRS scale: 6/10 Pain location: R distal forearm Pain description: cramping Aggravating  factors: positioning Relieving factors: heat Average 6-7/10 pain  FALLS: Has patient fallen in last 6 months? No   PLOF: Independent; works full time as Lawyer   PATIENT GOALS: Be able to use her hand normally to do her job, which requires a lot of typing, lifting items   OBJECTIVE: (from evaluation unless otherwise noted)  HAND DOMINANCE: Right  FUNCTIONAL OUTCOME MEASURES: Quick Dash: 59.09  UPPER EXTREMITY ROM:     BUE WNL though pain with testing  HAND FUNCTION: Grip strength: Right: 45.1 lbs; Left: 67 lbs  COORDINATION: 9 Hole Peg test: Right: 19 sec  SENSATION: Light touch: Impaired through median distribution of RUE  EDEMA: mild through RUE especially middle finger  COGNITION: Overall cognitive status: Within functional limits for tasks assessed  OBSERVATIONS: Pt ambulates from lobby to therapy gym without AD. No LOB. Pt appears well-kept.    TODAY'S TREATMENT:                                                                                                                              -  Ultrasound completed for duration as noted below including:  Ultrasound applied to R hand  and wrist for 8 minutes, frequency of 3 MHz, 20% duty cycle, and 1.1 W/cm with pt's arm placed on soft towel for promotion of ROM, edema reduction, and pain reduction in affected extremity. - Self-care/home management completed for duration as noted below including: OT educated pt on ergonomics as noted in pt instructions. She was advised she will likely continue to experience symptoms unless she is able to modify her daily tasks.   - Manual therapy completed for duration as noted below including: Due to muscle tightness and tenderness over R extensor wad, OT completed restoration of length tension relationship to musculature over this area to promote improved pain.  Pt sitting in chair: IASTM using edge mobility tool to R distal forearm using free up lotion as  emollient.  PATIENT EDUCATION: Education details: Nurse, mental health educated: Patient Education method: Consulting civil engineer, Media planner, and Handouts Education comprehension: verbalized understanding, returned demonstration, and needs further education  HOME EXERCISE PROGRAM: N/A  GOALS:   SHORT TERM GOALS: Target date: 08/29/2022  Pt will return demonstration of RUE HEP.   Baseline: not yet initiated Goal status: INITIAL  2.  Pt will be independent with R wrist brace wear for improved pain management.   Baseline: Initiated this date Goal status: INITIAL   LONG TERM GOALS: Target date: 10/03/2022   Pt will be independent with RUE HEP.   Baseline: not yet initiated Goal status: INITIAL  2.  - Patient will demonstrate at least 16% improvement with quick Dash score (reporting 43.09% disability or less) indicating improved functional use of affected extremity.  Baseline: 59.09% disability Goal status: INITIAL  3.  Patient will demonstrate at least 55 lbs R grip strength as needed to open jars and other containers.  Baseline: 45.1 lbs Goal status: INITIAL  ASSESSMENT:  CLINICAL IMPRESSION: Pt to benefit from additional skilled OT services in the outpatient setting for conservative management of CTS.    PERFORMANCE DEFICITS: in functional skills including ADLs, IADLs, coordination, sensation, strength, pain, body mechanics, and UE functional use.   IMPAIRMENTS: are limiting patient from ADLs, IADLs, rest and sleep, and work.   COMORBIDITIES: has no other co-morbidities that affects occupational performance. Patient will benefit from skilled OT to address above impairments and improve overall function.  MODIFICATION OR ASSISTANCE TO COMPLETE EVALUATION: No modification of tasks or assist necessary to complete an evaluation.  OT OCCUPATIONAL PROFILE AND HISTORY: Problem focused assessment: Including review of records relating to presenting problem.  CLINICAL DECISION  MAKING: LOW - limited treatment options, no task modification necessary  REHAB POTENTIAL: Fair for conservative management   PLAN:  OT FREQUENCY: 1x/week  OT DURATION: 5 weeks  PLANNED INTERVENTIONS: self care/ADL training, therapeutic exercise, therapeutic activity, manual therapy, passive range of motion, splinting, electrical stimulation, ultrasound, paraffin, fluidotherapy, cryotherapy, contrast bath, patient/family education, DME and/or AE instructions, and Re-evaluation  RECOMMENDED OTHER SERVICES: Orthopedics - unsure if referral has been placed  CONSULTED AND AGREED WITH PLAN OF CARE: Patient  PLAN FOR NEXT SESSION: [place], fluido, ergonomics; forearm eccentrics    Dennis Bast, OT 09/19/2022, 3:14 PM

## 2022-09-23 ENCOUNTER — Other Ambulatory Visit: Payer: Self-pay | Admitting: Obstetrics

## 2022-09-23 DIAGNOSIS — G5601 Carpal tunnel syndrome, right upper limb: Secondary | ICD-10-CM

## 2022-09-25 NOTE — Therapy (Signed)
OUTPATIENT OCCUPATIONAL THERAPY ORTHO TREATMENT  Patient Name: Mackenzie Pierce MRN: [id number] DOB:[birth year], 42 y.o., female Today's Date: 09/26/2022  PCP: Shelly Bombard, MD REFERRING PROVIDER: Shelly Bombard, MD  END OF SESSION:  OT End of Session - 09/26/22 1343     Visit Number 5    Number of Visits 5    Date for OT Re-Evaluation 10/03/22    Authorization Type [employer] with [place] Medicaid - requires auth (requesting 5 visits)    OT Start Time 1319    OT Stop Time 1400    OT Time Calculation (min) 41 min    Activity Tolerance Patient tolerated treatment well    Behavior During Therapy WFL for tasks assessed/performed             Past Medical History:  Diagnosis Date   Hypoglycemia    No pertinent past medical history    Past Surgical History:  Procedure Laterality Date   CESAREAN SECTION     x2   Patient Active Problem List   Diagnosis Date Noted   Vaginitis and vulvovaginitis, unspecified 08/01/2013   Urinary tract infection, site not specified 12/13/2012    ONSET DATE: 07/04/2022  REFERRING DIAG: G56.01 (ICD-10-CM) - Carpal tunnel syndrome on right  THERAPY DIAG:  Pain in right wrist  Muscle weakness (generalized)  Other disturbances of skin sensation  Rationale for Evaluation and Treatment: Rehabilitation  SUBJECTIVE:   SUBJECTIVE STATEMENT: She has not received a call from orthopedics yet to schedule an appointment. She did feel that IASTM was beneficial during her last visit to forearm pain and now has less numbness in 1st and 5th digits but continues to experience numbness in her middle finger.   Pt accompanied by: self  PERTINENT HISTORY: Pt referred by OBGYN given R carpal tunnel symptoms. Pt reports these symptoms had a sudden onset 1 month ago.   PRECAUTIONS: None  WEIGHT BEARING RESTRICTIONS: No  PAIN:  Are you having pain? Yes: NPRS scale: 5/10 Pain location: R distal forearm Pain description: cramping Aggravating  factors: positioning Relieving factors: heat Average 6-7/10 pain  FALLS: Has patient fallen in last 6 months? No   PLOF: Independent; works full time as Lawyer   PATIENT GOALS: Be able to use her hand normally to do her job, which requires a lot of typing, lifting items   OBJECTIVE: (from evaluation unless otherwise noted)  HAND DOMINANCE: Right  FUNCTIONAL OUTCOME MEASURES: Quick Dash: 59.09  UPPER EXTREMITY ROM:     BUE WNL though pain with testing  HAND FUNCTION: Grip strength: Right: 45.1 lbs; Left: 67 lbs  COORDINATION: 9 Hole Peg test: Right: 19 sec  SENSATION: Light touch: Impaired through median distribution of RUE  EDEMA: mild through RUE especially middle finger  COGNITION: Overall cognitive status: Within functional limits for tasks assessed  OBSERVATIONS: Pt ambulates from lobby to therapy gym without AD. No LOB. Pt appears well-kept.    TODAY'S TREATMENT:                                                                                                                              -  Ultrasound completed for duration as noted below including:  Ultrasound applied to R hand  and wrist for 8 minutes, frequency of 3 MHz, 20% duty cycle, and 1.1 W/cm with pt's arm placed on soft towel for promotion of ROM, edema reduction, and pain reduction in affected extremity. - Therapeutic exercises completed for duration as noted below including:  Pt placed BUE in Fluidotherapy machine with supervised ROM x 10 min. Pt was educated to complete R PROM and tendon glides during modality time to improve ROM and decrease pain/stiffness of affected extremity by use of the machine's massaging action and thermal properties.   OT initiated R forearm eccentric HEP as noted in pt instructions.Pt completed 2 sets PATIENT EDUCATION: Education details: R forearm HEP Person educated: Patient Education method: Explanation, Demonstration, and Handouts Education  comprehension: verbalized understanding, returned demonstration, and needs further education  HOME EXERCISE PROGRAM: 1/12 R forearm HEP  GOALS:   SHORT TERM GOALS: Target date: 08/29/2022  Pt will return demonstration of RUE HEP.   Baseline: not yet initiated Goal status: INITIAL  2.  Pt will be independent with R wrist brace wear for improved pain management.   Baseline: Initiated this date Goal status: INITIAL   LONG TERM GOALS: Target date: 10/03/2022   Pt will be independent with RUE HEP.   Baseline: not yet initiated Goal status: INITIAL  2.  - Patient will demonstrate at least 16% improvement with quick Dash score (reporting 43.09% disability or less) indicating improved functional use of affected extremity.  Baseline: 59.09% disability Goal status: INITIAL  3.  Patient will demonstrate at least 55 lbs R grip strength as needed to open jars and other containers.  Baseline: 45.1 lbs Goal status: INITIAL  ASSESSMENT:  CLINICAL IMPRESSION: Pt to benefit from additional skilled OT services in the outpatient setting for conservative management of RUE pain.    PERFORMANCE DEFICITS: in functional skills including ADLs, IADLs, coordination, sensation, strength, pain, body mechanics, and UE functional use.   IMPAIRMENTS: are limiting patient from ADLs, IADLs, rest and sleep, and work.   COMORBIDITIES: has no other co-morbidities that affects occupational performance. Patient will benefit from skilled OT to address above impairments and improve overall function.  MODIFICATION OR ASSISTANCE TO COMPLETE EVALUATION: No modification of tasks or assist necessary to complete an evaluation.  OT OCCUPATIONAL PROFILE AND HISTORY: Problem focused assessment: Including review of records relating to presenting problem.  CLINICAL DECISION MAKING: LOW - limited treatment options, no task modification necessary  REHAB POTENTIAL: Fair for conservative management   PLAN:  OT  FREQUENCY: 1x/week  OT DURATION: 5 weeks  PLANNED INTERVENTIONS: self care/ADL training, therapeutic exercise, therapeutic activity, manual therapy, passive range of motion, splinting, electrical stimulation, ultrasound, paraffin, fluidotherapy, cryotherapy, contrast bath, patient/family education, DME and/or AE instructions, and Re-evaluation  RECOMMENDED OTHER SERVICES: Orthopedics - unsure if referral has been placed  CONSULTED AND AGREED WITH PLAN OF CARE: Patient  PLAN FOR NEXT SESSION: [place], fluido, ergonomics; review forearm eccentrics    Dennis Bast, OT 09/26/2022, 2:20 PM

## 2022-09-26 ENCOUNTER — Ambulatory Visit: Payer: Medicaid Other | Admitting: Occupational Therapy

## 2022-09-26 ENCOUNTER — Encounter: Payer: Self-pay | Admitting: Occupational Therapy

## 2022-09-26 DIAGNOSIS — M25531 Pain in right wrist: Secondary | ICD-10-CM | POA: Diagnosis not present

## 2022-09-26 DIAGNOSIS — M6281 Muscle weakness (generalized): Secondary | ICD-10-CM

## 2022-09-26 DIAGNOSIS — R208 Other disturbances of skin sensation: Secondary | ICD-10-CM

## 2022-10-03 ENCOUNTER — Encounter: Payer: Medicaid Other | Admitting: Occupational Therapy

## 2022-10-10 ENCOUNTER — Ambulatory Visit: Payer: Medicaid Other | Admitting: Occupational Therapy

## 2022-10-14 ENCOUNTER — Ambulatory Visit: Payer: Medicaid Other | Admitting: Occupational Therapy

## 2022-10-14 ENCOUNTER — Encounter: Payer: Self-pay | Admitting: Occupational Therapy

## 2022-10-14 DIAGNOSIS — M25531 Pain in right wrist: Secondary | ICD-10-CM

## 2022-10-14 DIAGNOSIS — R208 Other disturbances of skin sensation: Secondary | ICD-10-CM

## 2022-10-14 DIAGNOSIS — M6281 Muscle weakness (generalized): Secondary | ICD-10-CM

## 2022-10-14 NOTE — Therapy (Signed)
OUTPATIENT OCCUPATIONAL THERAPY ORTHO TREATMENT  Patient Name: Mackenzie Pierce MRN: [id number] DOB:29-Mar-1981, 42 y.o., female Today's Date: 10/14/2022  PCP: Shelly Bombard, MD REFERRING PROVIDER: Shelly Bombard, MD  END OF SESSION:  OT End of Session - 10/14/22 1519     Visit Number 6    Number of Visits 5    Date for OT Re-Evaluation 10/03/22    Authorization Type [employer] with [place] Medicaid - requires auth (requesting 5 visits)    OT Start Time 1448    OT Stop Time 1529    OT Time Calculation (min) 41 min    Activity Tolerance Patient tolerated treatment well    Behavior During Therapy WFL for tasks assessed/performed              Past Medical History:  Diagnosis Date   Hypoglycemia    No pertinent past medical history    Past Surgical History:  Procedure Laterality Date   CESAREAN SECTION     x2   Patient Active Problem List   Diagnosis Date Noted   Vaginitis and vulvovaginitis, unspecified 08/01/2013   Urinary tract infection, site not specified 12/13/2012    ONSET DATE: 07/04/2022  REFERRING DIAG: G56.01 (ICD-10-CM) - Carpal tunnel syndrome on right  THERAPY DIAG:  Pain in right wrist  Muscle weakness (generalized)  Other disturbances of skin sensation  Rationale for Evaluation and Treatment: Rehabilitation  SUBJECTIVE:   SUBJECTIVE STATEMENT: She has improved sensation in R ring finger but still has a lack of sensation in middle finger. Less stiffening and did not take Aleve today.   Pt accompanied by: self  PERTINENT HISTORY: Pt referred by OBGYN given R carpal tunnel symptoms. Pt reports these symptoms had a sudden onset 1 month ago.   PRECAUTIONS: None  WEIGHT BEARING RESTRICTIONS: No  PAIN:  Are you having pain? Yes: NPRS scale: 4/10 Pain location: R distal forearm Pain description: cramping Aggravating factors: positioning Relieving factors: heat Average 6-7/10 pain in last week; 9/10 at worst in last 24 hours (shooting  pain)  FALLS: Has patient fallen in last 6 months? No   PLOF: Independent; works full time as Lawyer   PATIENT GOALS: Be able to use her hand normally to do her job, which requires a lot of typing, lifting items   OBJECTIVE: (from evaluation unless otherwise noted)  HAND DOMINANCE: Right  FUNCTIONAL OUTCOME MEASURES: Quick Dash: 59.09  UPPER EXTREMITY ROM:     BUE WNL though pain with testing  HAND FUNCTION: Grip strength: Right: 45.1 lbs; Left: 67 lbs  COORDINATION: 9 Hole Peg test: Right: 19 sec  SENSATION: Light touch: Impaired through median distribution of RUE  EDEMA: mild through RUE especially middle finger  COGNITION: Overall cognitive status: Within functional limits for tasks assessed  OBSERVATIONS: Pt ambulates from lobby to therapy gym without AD. No LOB. Pt appears well-kept.    TODAY'S TREATMENT:                                                                                                                              -  Ultrasound completed for duration as noted below including:  Ultrasound applied to R hand  and wrist for 8 minutes, frequency of 3 MHz, 20% duty cycle, and 1.1 W/cm with pt's arm placed on soft towel for promotion of ROM, edema reduction, and pain reduction in affected extremity. - Therapeutic exercises completed for duration as noted below including:  Pt placed BUE in Fluidotherapy machine with supervised ROM x 10 min. Pt was educated to complete R PROM and tendon glides during modality time to improve ROM and decrease pain/stiffness of affected extremity by use of the machine's massaging action and thermal properties.   OT reviewed R forearm eccentric HEP as noted in pt instructions.Pt completed 1 set independently  - Manual therapy completed for duration as noted below including:  Restoration of length tension relationship to musculature at *** R shoulder to promote improved AROM and pain reduction of  affected extremity.  Pt sitting in chair: IASTM using edge mobility tool to *** upper trapezius muscle using free up lotion as emollient *** as patient completed AAROM right shoulder flexion, horizontal abduction, and horizontal adduction.  Noted improvement to *** R shoulder flexion AROM following completion. PATIENT EDUCATION: Education details: R forearm HEP Person educated: Patient Education method: Explanation, Demonstration, and Handouts Education comprehension: verbalized understanding, returned demonstration, and needs further education  HOME EXERCISE PROGRAM: 1/12 R forearm HEP  GOALS:   SHORT TERM GOALS: Target date: 08/29/2022  Pt will return demonstration of RUE HEP.   Baseline: not yet initiated Goal status: MET  2.  Pt will be independent with R wrist brace wear for improved pain management.   Baseline: Initiated this date Goal status: MET   LONG TERM GOALS: Target date: 10/03/2022   Pt will be independent with RUE HEP.   Baseline: not yet initiated Goal status: MET  2.  - Patient will demonstrate at least 16% improvement with quick Dash score (reporting 43.09% disability or less) indicating improved functional use of affected extremity.  Baseline: 59.09% disability 1/30 -  Goal status: INITIAL  3.  Patient will demonstrate at least 55 lbs R grip strength as needed to open jars and other containers.  Baseline: 45.1 lbs 1/30 - 45.8 lbs Goal status: IN PROGRESS  ASSESSMENT:  CLINICAL IMPRESSION: Pt to benefit from additional skilled OT services in the outpatient setting for conservative management of RUE pain.    PERFORMANCE DEFICITS: in functional skills including ADLs, IADLs, coordination, sensation, strength, pain, body mechanics, and UE functional use.   IMPAIRMENTS: are limiting patient from ADLs, IADLs, rest and sleep, and work.   COMORBIDITIES: has no other co-morbidities that affects occupational performance. Patient will benefit from skilled  OT to address above impairments and improve overall function.  MODIFICATION OR ASSISTANCE TO COMPLETE EVALUATION: No modification of tasks or assist necessary to complete an evaluation.  OT OCCUPATIONAL PROFILE AND HISTORY: Problem focused assessment: Including review of records relating to presenting problem.  CLINICAL DECISION MAKING: LOW - limited treatment options, no task modification necessary  REHAB POTENTIAL: Fair for conservative management   PLAN:  OT FREQUENCY: 1x/week  OT DURATION: 5 weeks  PLANNED INTERVENTIONS: self care/ADL training, therapeutic exercise, therapeutic activity, manual therapy, passive range of motion, splinting, electrical stimulation, ultrasound, paraffin, fluidotherapy, cryotherapy, contrast bath, patient/family education, DME and/or AE instructions, and Re-evaluation  RECOMMENDED OTHER SERVICES: Orthopedics - unsure if referral has been placed  CONSULTED AND AGREED WITH PLAN OF CARE: Patient  PLAN FOR NEXT SESSION: [place], fluido, ergonomics; review forearm eccentrics  Dennis Bast, OT 10/14/2022, 5:04 PM

## 2022-10-27 ENCOUNTER — Ambulatory Visit: Payer: Medicaid Other | Admitting: Orthopedic Surgery

## 2024-05-04 ENCOUNTER — Encounter: Payer: Self-pay | Admitting: Obstetrics

## 2024-05-04 ENCOUNTER — Other Ambulatory Visit (HOSPITAL_COMMUNITY)
Admission: RE | Admit: 2024-05-04 | Discharge: 2024-05-04 | Disposition: A | Discharge status: Home / Self Care | Source: Ambulatory Visit | Attending: Obstetrics | Admitting: Obstetrics

## 2024-05-04 ENCOUNTER — Ambulatory Visit (INDEPENDENT_AMBULATORY_CARE_PROVIDER_SITE_OTHER): Admitting: Obstetrics

## 2024-05-04 VITALS — BP 137/85 | HR 68 | Ht 59.0 in | Wt 136.0 lb

## 2024-05-04 DIAGNOSIS — J301 Allergic rhinitis due to pollen: Secondary | ICD-10-CM

## 2024-05-04 DIAGNOSIS — Z1239 Encounter for other screening for malignant neoplasm of breast: Secondary | ICD-10-CM

## 2024-05-04 DIAGNOSIS — N760 Acute vaginitis: Secondary | ICD-10-CM

## 2024-05-04 DIAGNOSIS — Z01419 Encounter for gynecological examination (general) (routine) without abnormal findings: Secondary | ICD-10-CM | POA: Insufficient documentation

## 2024-05-04 DIAGNOSIS — N946 Dysmenorrhea, unspecified: Secondary | ICD-10-CM

## 2024-05-04 DIAGNOSIS — B9689 Other specified bacterial agents as the cause of diseases classified elsewhere: Secondary | ICD-10-CM

## 2024-05-04 MED ORDER — METRONIDAZOLE 500 MG PO TABS: 500.0000 mg | ORAL_TABLET | Freq: Two times a day (BID) | ORAL | 2 refills | Status: AC

## 2024-05-04 MED ORDER — CETIRIZINE HCL 10 MG PO TABS: 10.0000 mg | ORAL_TABLET | Freq: Every day | ORAL | 11 refills | Status: AC

## 2024-05-04 MED ORDER — IBUPROFEN 800 MG PO TABS: 800.0000 mg | ORAL_TABLET | Freq: Three times a day (TID) | ORAL | 5 refills | Status: AC | PRN

## 2024-05-04 NOTE — Progress Notes (Signed)
 Pt presents for annual. Pt has no questions or concerns at this time. Pt declines all std testing.

## 2024-05-04 NOTE — Progress Notes (Signed)
 Subjective:        Mackenzie Pierce is a 43 y.o. female here for a routine exam.  Current complaints: None.    Personal health questionnaire:  Is patient Mackenzie Pierce, have a family history of breast and/or ovarian cancer: no Is there a family history of uterine cancer diagnosed at age < 77, gastrointestinal cancer, urinary tract cancer, family member who is a Personnel officer syndrome-associated carrier: no Is the patient overweight and hypertensive, family history of diabetes, personal history of gestational diabetes, preeclampsia or PCOS: no Is patient over 33, have PCOS,  family history of premature CHD under age 74, diabetes, smoke, have hypertension or peripheral artery disease:  no At any time, has a partner hit, kicked or otherwise hurt or frightened you?: no Over the past 2 weeks, have you felt down, depressed or hopeless?: no Over the past 2 weeks, have you felt little interest or pleasure in doing things?:no   Gynecologic History Patient's last menstrual period was 04/15/2024 (approximate). Contraception: IUD Last Pap: 2023. Results were: normal Last mammogram: 2023. Results were: normal  Obstetric History OB History  Gravida Para Term Preterm AB Living  2 2 2  0 0 2  SAB IAB Ectopic Multiple Live Births  0 0 0 0 2    # Outcome Date GA Lbr Len/2nd Weight Sex Type Anes PTL Lv  2 Term      CS-LTranv   LIV     Birth Comments: repeat c/section  1 Term      CS-LTranv   LIV     Birth Comments: failure to progress    Past Medical History:  Diagnosis Date   Hypoglycemia    No pertinent past medical history     Past Surgical History:  Procedure Laterality Date   CESAREAN SECTION     x2     Current Outpatient Medications:    ibuprofen  (ADVIL ) 800 MG tablet, Take 1 tablet (800 mg total) by mouth every 8 (eight) hours as needed., Disp: 30 tablet, Rfl: 5   levonorgestrel  (MIRENA ) 20 MCG/24HR IUD, 1 each by Intrauterine route once., Disp: , Rfl:    cetirizine  (ZYRTEC ) 10  MG tablet, Take 1 tablet (10 mg total) by mouth daily., Disp: 30 tablet, Rfl: 11   metroNIDAZOLE  (FLAGYL ) 500 MG tablet, Take 1 tablet (500 mg total) by mouth 2 (two) times daily. (Patient not taking: Reported on 05/04/2024), Disp: 14 tablet, Rfl: 2   metroNIDAZOLE  (FLAGYL ) 500 MG tablet, Take 1 tablet (500 mg total) by mouth 2 (two) times daily. (Patient not taking: Reported on 05/04/2024), Disp: 14 tablet, Rfl: 2   metroNIDAZOLE  (FLAGYL ) 500 MG tablet, Take 1 tablet (500 mg total) by mouth 2 (two) times daily., Disp: 14 tablet, Rfl: 2   Multiple Vitamins-Minerals (MULTIVITAMIN GUMMIES ADULT PO), Take by mouth. (Patient not taking: Reported on 07/10/2022), Disp: , Rfl:    pantoprazole  (PROTONIX ) 40 MG tablet, Take 1 tablet (40 mg total) by mouth daily. (Patient not taking: Reported on 05/04/2024), Disp: 30 tablet, Rfl: 0 Allergies  Allergen Reactions   Pineapple Swelling    Lip swelling   Elemental Sulfur Swelling   Milk-Related Compounds Hives   Orange Concentrate [Flavoring Agent (Non-Screening)] Hives   Tylenol [Acetaminophen] Swelling    Any tylenol based medication makes her swell    Social History   Tobacco Use   Smoking status: Never    Passive exposure: Never   Smokeless tobacco: Never  Substance Use Topics   Alcohol use: Yes  Comment: occ    Family History  Problem Relation Age of Onset   Diabetes Mother    Heart murmur Mother    Diabetes type II Father    Lymphoma Father    HIV Father    Anesthesia problems Neg Hx    Hypotension Neg Hx    Malignant hyperthermia Neg Hx    Pseudochol deficiency Neg Hx       Review of Systems  Constitutional: negative for fatigue and weight loss Respiratory: negative for cough and wheezing Cardiovascular: negative for chest pain, fatigue and palpitations Gastrointestinal: negative for abdominal pain and change in bowel habits Musculoskeletal:negative for myalgias Neurological: negative for gait problems and  tremors Behavioral/Psych: negative for abusive relationship, depression Endocrine: negative for temperature intolerance    Genitourinary:negative for abnormal menstrual periods, genital lesions, hot flashes, sexual problems and vaginal discharge Integument/breast: negative for breast lump, breast tenderness, nipple discharge and skin lesion(s)    Objective:       BP 137/85   Pulse 68   Ht 4' 11 (1.499 m)   Wt 136 lb (61.7 kg)   LMP 04/15/2024 (Approximate)   BMI 27.47 kg/m  General:   Alert and no distress  Skin:   no rash or abnormalities  Lungs:   clear to auscultation bilaterally  Heart:   regular rate and rhythm, S1, S2 normal, no murmur, click, rub or gallop  Breasts:   normal without suspicious masses, skin or nipple changes or axillary nodes  Abdomen:  normal findings: no organomegaly, soft, non-tender and no hernia  Pelvis:  External genitalia: normal general appearance Urinary system: urethral meatus normal and bladder without fullness, nontender Vaginal: normal without tenderness, induration or masses Cervix: normal appearance Adnexa: normal bimanual exam Uterus: anteverted and non-tender, normal size   Lab Review Urine pregnancy test Labs reviewed yes Radiologic studies reviewed yes  I have spent a total of 20 minutes of face-to-face time, excluding clinical staff time, reviewing notes and preparing to see patient, ordering tests and/or medications, and counseling the patient.   Assessment:    1. Encounter for routine gynecological examination with Papanicolaou smear of cervix (Primary) Rx: - Cytology - PAP( [facility])  2. Dysmenorrhea Rx: - ibuprofen  (ADVIL ) 800 MG tablet; Take 1 tablet (800 mg total) by mouth every 8 (eight) hours as needed.  Dispense: 30 tablet; Refill: 5  3. BV (bacterial vaginosis) Rx: - metroNIDAZOLE  (FLAGYL ) 500 MG tablet; Take 1 tablet (500 mg total) by mouth 2 (two) times daily.  Dispense: 14 tablet; Refill: 2  4. Seasonal  allergic rhinitis due to pollen Rx: - cetirizine  (ZYRTEC ) 10 MG tablet; Take 1 tablet (10 mg total) by mouth daily.  Dispense: 30 tablet; Refill: 11  5. Screening breast examination Rx: - MM 3D SCREENING MAMMOGRAM BILATERAL BREAST; Future     Plan:    Education reviewed: calcium supplements, depression evaluation, low fat, low cholesterol diet, safe sex/STD prevention, self breast exams, and weight bearing exercise. Contraception: IUD. Mammogram ordered. Follow up in: 1 year.   Meds ordered this encounter  Medications   cetirizine  (ZYRTEC ) 10 MG tablet    Sig: Take 1 tablet (10 mg total) by mouth daily.    Dispense:  30 tablet    Refill:  11   ibuprofen  (ADVIL ) 800 MG tablet    Sig: Take 1 tablet (800 mg total) by mouth every 8 (eight) hours as needed.    Dispense:  30 tablet    Refill:  5   metroNIDAZOLE  (  FLAGYL ) 500 MG tablet    Sig: Take 1 tablet (500 mg total) by mouth 2 (two) times daily.    Dispense:  14 tablet    Refill:  2   Orders Placed This Encounter  Procedures   MM 3D SCREENING MAMMOGRAM BILATERAL BREAST    Standing Status:   Future    Expiration Date:   05/04/2025    Reason for Exam (SYMPTOM  OR DIAGNOSIS REQUIRED):   Screening    Is the patient pregnant?:   No    Preferred imaging location?:   [facility]    CARLIN RONAL CENTERS, MD, FACOG Attending Obstetrician & Gynecologist, [facility] Building for [facility], [facility], [place] 05/04/2024

## 2024-05-06 LAB — CYTOLOGY - PAP
Comment: NEGATIVE
Diagnosis: NEGATIVE
Diagnosis: REACTIVE
High risk HPV: NEGATIVE

## 2024-06-03 ENCOUNTER — Encounter (HOSPITAL_BASED_OUTPATIENT_CLINIC_OR_DEPARTMENT_OTHER): Payer: Self-pay | Admitting: Radiology

## 2024-06-03 ENCOUNTER — Ambulatory Visit (HOSPITAL_BASED_OUTPATIENT_CLINIC_OR_DEPARTMENT_OTHER)
Admission: RE | Admit: 2024-06-03 | Discharge: 2024-06-03 | Disposition: A | Discharge status: Home / Self Care | Source: Ambulatory Visit | Attending: Obstetrics | Admitting: Obstetrics

## 2024-06-03 DIAGNOSIS — Z1239 Encounter for other screening for malignant neoplasm of breast: Secondary | ICD-10-CM

## 2024-06-03 DIAGNOSIS — Z1231 Encounter for screening mammogram for malignant neoplasm of breast: Secondary | ICD-10-CM | POA: Diagnosis present

## 2024-06-07 ENCOUNTER — Ambulatory Visit: Payer: Self-pay | Admitting: Family Medicine
